# Patient Record
Sex: Male | Born: 1970 | Race: Black or African American | Hispanic: No | Marital: Married | Smoking: Never smoker
Health system: Southern US, Community
[De-identification: ages and names within clinical notes are randomized; demographics above are authoritative.]

## PROBLEM LIST (undated history)

## (undated) DIAGNOSIS — I499 Cardiac arrhythmia, unspecified: Secondary | ICD-10-CM

## (undated) DIAGNOSIS — I4891 Unspecified atrial fibrillation: Secondary | ICD-10-CM

## (undated) HISTORY — PX: PATELLAR TENDON REPAIR: SHX737

---

## 2002-11-23 ENCOUNTER — Emergency Department (HOSPITAL_COMMUNITY): Admission: EM | Admit: 2002-11-23 | Discharge: 2002-11-23 | Payer: Self-pay | Admitting: Emergency Medicine

## 2003-08-18 ENCOUNTER — Inpatient Hospital Stay (HOSPITAL_COMMUNITY): Admission: EM | Admit: 2003-08-18 | Discharge: 2003-08-28 | Payer: Self-pay | Admitting: Emergency Medicine

## 2003-10-09 ENCOUNTER — Encounter: Admission: RE | Admit: 2003-10-09 | Discharge: 2003-12-04 | Payer: Self-pay | Admitting: Orthopedic Surgery

## 2004-03-20 ENCOUNTER — Ambulatory Visit: Payer: Self-pay | Admitting: Orthopedic Surgery

## 2004-06-17 ENCOUNTER — Observation Stay (HOSPITAL_COMMUNITY): Admission: EM | Admit: 2004-06-17 | Discharge: 2004-06-18 | Payer: Self-pay | Admitting: Emergency Medicine

## 2012-11-10 ENCOUNTER — Emergency Department (HOSPITAL_COMMUNITY)
Admission: EM | Admit: 2012-11-10 | Discharge: 2012-11-10 | Disposition: A | Discharge status: Home / Self Care | Payer: Self-pay | Attending: Emergency Medicine | Admitting: Emergency Medicine

## 2012-11-10 ENCOUNTER — Encounter (HOSPITAL_COMMUNITY): Payer: Self-pay | Admitting: *Deleted

## 2012-11-10 DIAGNOSIS — R0609 Other forms of dyspnea: Secondary | ICD-10-CM | POA: Insufficient documentation

## 2012-11-10 DIAGNOSIS — I480 Paroxysmal atrial fibrillation: Secondary | ICD-10-CM

## 2012-11-10 DIAGNOSIS — R0989 Other specified symptoms and signs involving the circulatory and respiratory systems: Secondary | ICD-10-CM | POA: Insufficient documentation

## 2012-11-10 DIAGNOSIS — R000 Tachycardia, unspecified: Secondary | ICD-10-CM | POA: Insufficient documentation

## 2012-11-10 DIAGNOSIS — I4891 Unspecified atrial fibrillation: Secondary | ICD-10-CM | POA: Insufficient documentation

## 2012-11-10 HISTORY — DX: Unspecified atrial fibrillation: I48.91

## 2012-11-10 LAB — POCT I-STAT TROPONIN I: Troponin i, poc: 0.05 ng/mL (ref 0.00–0.08)

## 2012-11-10 MED ORDER — DILTIAZEM HCL 25 MG/5ML IV SOLN
25.0000 mg | Freq: Once | INTRAVENOUS | Status: AC
  Administered 2012-11-10: 25 mg via INTRAVENOUS
  Filled 2012-11-10: qty 5

## 2012-11-10 MED ORDER — FLECAINIDE ACETATE 100 MG PO TABS
300.0000 mg | ORAL_TABLET | Freq: Once | ORAL | Status: AC
  Administered 2012-11-10: 300 mg via ORAL
  Filled 2012-11-10: qty 3

## 2012-11-10 MED ORDER — DILTIAZEM HCL 100 MG IV SOLR
5.0000 mg/h | INTRAVENOUS | Status: DC
  Administered 2012-11-10: 5 mg/h via INTRAVENOUS
  Filled 2012-11-10: qty 100

## 2012-11-10 NOTE — ED Notes (Signed)
Pt ambulating independently w/ steady gait on d/c in no acute distress, A&Ox4.D/c instructions reviewed w/ pt and family - pt and family deny any further questions or concerns at present.  

## 2012-11-10 NOTE — ED Notes (Signed)
Per EMS - pt from home, awoke from his sleep d/t rapid heart rate - pt found to be in a-fib on cardiac monitor at a rate of 200-220s, pt given IV adenosine 6mg  w/o change, pt was then given 20mg  of Cardizem and heart rate came down to 118, pt states he has a hx of atrial fibrillation which a cardiologist told him was d/t taking hydroxycut, pt is not currently taking any cardiac medications or anticoagulants. Pt A&Ox4 on arrival and in no acute distress, skin warm and dry.

## 2012-11-10 NOTE — ED Provider Notes (Signed)
CSN: [id number]     Arrival date & time 11/10/12  8469 History   First MD Initiated Contact with Patient 11/10/12 [phone]     Chief Complaint  Patient presents with  . Atrial Fibrillation  . Tachycardia   (Consider location/radiation/quality/duration/timing/severity/associated sxs/prior Treatment) Patient is a 42 y.o. male presenting with atrial fibrillation. The history is provided by the patient.  Atrial Fibrillation  He had onset about 2 hours ago of a sense of his heart racing. He denies chest pain, heaviness, tightness, pressure. There was slight dyspnea but no nausea or vomiting or diaphoresis. He has a history of major fibrillation in the past and this is similar. Last episode was about 8 years ago per nothing makes it better nothing makes it worse. He was given a dose of adenosine in the ambulance with no improvement and then given a dose of the chiasm which had his heart rate slowed to 118 before going back up. He denies smoking, drug use, ethanol use.  No past medical history on file. No past surgical history on file. No family history on file. History  Substance Use Topics  . Smoking status: Not on file  . Smokeless tobacco: Not on file  . Alcohol Use: Not on file    Review of Systems  All other systems reviewed and are negative.    Allergies  Review of patient's allergies indicates not on file.  Home Medications  No current outpatient prescriptions on file. BP 149/114  Pulse 60  Temp(Src) 97.9 F (36.6 C) (Oral)  Resp 28  Ht 5\' 10"  (1.778 m)  SpO2 99% Physical Exam  Nursing note and vitals reviewed.  Morbidly obese 42 year old male, resting comfortably and in no acute distress. Vital signs are significant for tachypnea with respiratory rate of 28, hypertension with blood pressure 149/114, and tachycardia with heart rate of 178. Oxygen saturation is 99%, which is normal. Head is normocephalic and atraumatic. PERRLA, EOMI. Oropharynx is clear. Neck is nontender  and supple without adenopathy or JVD. Back is nontender and there is no CVA tenderness. Lungs are clear without rales, wheezes, or rhonchi. Chest is nontender. Heart is tachycardic and irregular without murmur. Abdomen is soft, flat, nontender without masses or hepatosplenomegaly and peristalsis is normoactive. Extremities have no cyanosis or edema, full range of motion is present. Skin is warm and dry without rash. Neurologic: Mental status is normal, cranial nerves are intact, there are no motor or sensory deficits.  ED Course  Procedures (including critical care time) Labs Review Results for orders placed during the hospital encounter of 11/10/12  POCT I-STAT TROPONIN I      Result Value Range   Troponin i, poc 0.05  0.00 - 0.08 ng/mL   Comment 3              Date: 11/10/2012  Rate: 151  Rhythm: atrial fibrillation  QRS Axis: normal  Intervals: normal  ST/T Wave abnormalities: nonspecific ST/T changes  Conduction Disutrbances:none  Narrative Interpretation:  Atrial fibrillation with rapid ventricular response. Nonspecific ST and T changes which are probably rate related. No prior ECGs available for comparison.  Old EKG Reviewed: none available  CRITICAL CARE Performed by: MWUXL,KGMWN Total critical care time: 35 minutes Critical care time was exclusive of separately billable procedures and treating other patients. Critical care was necessary to treat or prevent imminent or life-threatening deterioration. Critical care was time spent personally by me on the following activities: development of treatment plan with patient and/or surrogate as  well as nursing, discussions with consultants, evaluation of patient's response to treatment, examination of patient, obtaining history from patient or surrogate, ordering and performing treatments and interventions, ordering and review of laboratory studies, ordering and review of radiographic studies, pulse oximetry and re-evaluation of  patient's condition.  MDM   1. Paroxysmal atrial fibrillation    New onset atrial fibrillation. Old records are reviewed and he did have a hospitalization in 2006 and one in 2005 but that no discharge summary or history and physical are on record. No prior ECGs are available. However, he has no history of coronary artery disease so he would be a good candidate for treatment with flecainide. He will be given to chiasm and placed on a diltiazem drip to control rate and is given a dose of oral flecainide.  6:16 AM He has converted to sinus rhythm. He is discharged with referral to cardiology.  Dione Booze, MD 11/10/12 [phone]

## 2012-12-01 ENCOUNTER — Encounter: Payer: Self-pay | Admitting: Cardiology

## 2013-01-02 ENCOUNTER — Encounter (HOSPITAL_COMMUNITY): Payer: Self-pay | Admitting: Emergency Medicine

## 2013-01-02 ENCOUNTER — Emergency Department (HOSPITAL_COMMUNITY)
Admission: EM | Admit: 2013-01-02 | Discharge: 2013-01-02 | Disposition: A | Discharge status: Home / Self Care | Payer: BC Managed Care – PPO | Attending: Emergency Medicine | Admitting: Emergency Medicine

## 2013-01-02 ENCOUNTER — Emergency Department (HOSPITAL_COMMUNITY): Payer: BC Managed Care – PPO

## 2013-01-02 DIAGNOSIS — M25462 Effusion, left knee: Secondary | ICD-10-CM

## 2013-01-02 DIAGNOSIS — M25469 Effusion, unspecified knee: Secondary | ICD-10-CM | POA: Insufficient documentation

## 2013-01-02 DIAGNOSIS — Z8679 Personal history of other diseases of the circulatory system: Secondary | ICD-10-CM | POA: Insufficient documentation

## 2013-01-02 DIAGNOSIS — Z9889 Other specified postprocedural states: Secondary | ICD-10-CM | POA: Insufficient documentation

## 2013-01-02 MED ORDER — IBUPROFEN 800 MG PO TABS: 800.0000 mg | ORAL_TABLET | Freq: Three times a day (TID) | ORAL | Status: DC

## 2013-01-02 MED ORDER — HYDROCODONE-ACETAMINOPHEN 5-325 MG PO TABS: 1.0000 | ORAL_TABLET | ORAL | Status: DC | PRN

## 2013-01-02 NOTE — ED Notes (Signed)
Patient with c/o left knee pain x 3 days. H/o patellar tendon repair in left knee. Denies any injury.

## 2013-01-02 NOTE — ED Notes (Signed)
nad noted prior to dc. Dc instructions reviewed with pt and explained. 2 scripts given prior to dc.

## 2013-01-04 NOTE — ED Provider Notes (Signed)
CSN: [id number]     Arrival date & time 01/02/13  1478 History   First MD Initiated Contact with Patient 01/02/13 1015     Chief Complaint  Patient presents with  . Knee Pain   (Consider location/radiation/quality/duration/timing/severity/associated sxs/prior Treatment) HPI Comments: Faheem Ziemann is a 42 y.o. Male presenting with left knee pain and swelling for the past 3 days.  He denies specific injury but does walks and stands frequently with his job. He had bilateral patellar tendon rupture repairs 9 years ago by Dr. Romeo Apple.  His pain is constant, aching and worse with weight bearing, better at rest.  There is no radiation of pain.   Patient is a 42 y.o. male presenting with knee pain. The history is provided by the patient.  Knee Pain Associated symptoms: no fever     Past Medical History  Diagnosis Date  . Atrial fibrillation    Past Surgical History  Procedure Laterality Date  . Patellar tendon repair     No family history on file. History  Substance Use Topics  . Smoking status: Never Smoker   . Smokeless tobacco: Not on file  . Alcohol Use: No    Review of Systems  Constitutional: Negative for fever.  Musculoskeletal: Positive for arthralgias and joint swelling. Negative for myalgias.  Neurological: Negative for weakness and numbness.    Allergies  Bee venom  Home Medications   Current Outpatient Rx  Name  Route  Sig  Dispense  Refill  . HYDROcodone-acetaminophen (NORCO/VICODIN) 5-325 MG per tablet   Oral   Take 1 tablet by mouth every 4 (four) hours as needed for moderate pain.   20 tablet   0   . ibuprofen (ADVIL,MOTRIN) 800 MG tablet   Oral   Take 1 tablet (800 mg total) by mouth 3 (three) times daily.   21 tablet   0    BP 162/98  Pulse 95  Temp(Src) 98.7 F (37.1 C) (Oral)  Resp 22  Ht 5\' 10"  (1.778 m)  Wt 355 lb (161.027 kg)  BMI 50.94 kg/m2  SpO2 98% Physical Exam  Constitutional: He appears well-developed and well-nourished.   HENT:  Head: Atraumatic.  Neck: Normal range of motion.  Cardiovascular:  Pulses:      Dorsalis pedis pulses are 2+ on the right side, and 2+ on the left side.  Pulses equal bilaterally  Musculoskeletal: He exhibits edema and tenderness.       Right shoulder: He exhibits bony tenderness and swelling. He exhibits no deformity.  Well healed bilateral anterior knee surgical incisions.  TTP anterior knee and patella.  Possible mild edema, difficult to determine given obesity. No ballotable effusion. Calf nontender.  Neurological: He is alert. He has normal strength. He displays normal reflexes. No sensory deficit.  Equal strength  Skin: Skin is warm and dry.  Psychiatric: He has a normal mood and affect.    ED Course  Procedures (including critical care time) Labs Review Labs Reviewed - No data to display Imaging Review Dg Knee Complete 4 Views Left  01/02/2013   CLINICAL DATA:  Left knee pain  EXAM: LEFT KNEE - COMPLETE 4+ VIEW  COMPARISON:  08/18/2003  FINDINGS: No fracture. No subluxation or dislocation. Hypertrophic spurring is visible in all 3 compartments. There is probably a joint effusion associated.  IMPRESSION: Tricompartmental degenerative changes.   Electronically Signed   By: Kennith Center M.D.   On: 01/02/2013 10:45    EKG Interpretation   None  MDM   1. Knee effusion, left    Effusion per xrays which were reviewed with patient. He was placed in knee immobilizer, crutches given.  Hydrocodone, ibuprofen,  RICE,  F/u with Dr. Romeo Apple.  Pt to call for appt.  Patients labs and/or radiological studies were viewed and considered during the medical decision making and disposition process.     Burgess Amor, PA-C 01/04/13 [phone]

## 2013-01-05 NOTE — ED Provider Notes (Signed)
Medical screening examination/treatment/procedure(s) were performed by non-physician practitioner and as supervising physician I was immediately available for consultation/collaboration.  EKG Interpretation   None        Ramzi Brathwaite, MD 01/05/13 1042 

## 2013-01-18 ENCOUNTER — Ambulatory Visit (INDEPENDENT_AMBULATORY_CARE_PROVIDER_SITE_OTHER): Payer: BC Managed Care – PPO | Admitting: Orthopedic Surgery

## 2013-01-18 ENCOUNTER — Encounter: Payer: Self-pay | Admitting: Orthopedic Surgery

## 2013-01-18 VITALS — BP 172/90 | Ht 70.5 in | Wt 371.0 lb

## 2013-01-18 DIAGNOSIS — S83249A Other tear of medial meniscus, current injury, unspecified knee, initial encounter: Secondary | ICD-10-CM | POA: Insufficient documentation

## 2013-01-18 DIAGNOSIS — IMO0002 Reserved for concepts with insufficient information to code with codable children: Secondary | ICD-10-CM

## 2013-01-18 DIAGNOSIS — S83242A Other tear of medial meniscus, current injury, left knee, initial encounter: Secondary | ICD-10-CM

## 2013-01-18 DIAGNOSIS — M23322 Other meniscus derangements, posterior horn of medial meniscus, left knee: Secondary | ICD-10-CM

## 2013-01-18 DIAGNOSIS — M23329 Other meniscus derangements, posterior horn of medial meniscus, unspecified knee: Secondary | ICD-10-CM

## 2013-01-18 MED ORDER — HYDROCODONE-ACETAMINOPHEN 5-325 MG PO TABS: 1.0000 | ORAL_TABLET | ORAL | Status: DC | PRN

## 2013-01-18 MED ORDER — IBUPROFEN 800 MG PO TABS: 800.0000 mg | ORAL_TABLET | Freq: Three times a day (TID) | ORAL | Status: DC

## 2013-01-18 NOTE — Patient Instructions (Signed)
MRI ordered

## 2013-01-18 NOTE — Progress Notes (Signed)
Patient ID: [name] Walters, male   DOB: 1970-07-19, 42 y.o.   MRN: [id number]  Chief Complaint  Patient presents with  . Knee Pain    Left knee pain d/t injury 12/29/12    Work-related injury  Left knee pain 8/10 constant. Medial sided knee. Sudden onset. Secondary to injury. Date of injury November 6.  Details of the injury 42 year old male who works for [employer]  He was carrying oxygen up some steps he planted going up the steps fell medial pain but was able to continue his job. 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 CDV: pulses are normal   3. Skin: normal  4. Lymph: nodes were not palpable/normal  5/6. Psychiatric: awake, alert and oriented, mood and affect normal   7. Neuro: normal sensation  8.   MSK  Gait: Abnormal gait, crutches, knee immobilizer 9.   Inspection left knee large anterior scar from previous surgery tenderness medial joint line no joint effusion 10. Range of Motion 0-90 11. Motor normal 12. Stability normal ligaments, positive Murray sign for medial joint line tenderness medial meniscal tear   Imaging x-ray  negative  Assessment: Torn medial meniscus unrelated to his previous patella tendon surgery    Plan: Recommend continue brace and crutches ice ibuprofen hydrocodone  Images and knee with MRI to assess meniscal tear, if torn recommend surgical removal  Out of work until MRI and then possibly further if positive .

## 2013-01-24 ENCOUNTER — Ambulatory Visit (HOSPITAL_COMMUNITY): Payer: BC Managed Care – PPO

## 2013-01-25 ENCOUNTER — Telehealth: Payer: Self-pay | Admitting: Radiology

## 2013-01-25 NOTE — Telephone Encounter (Signed)
Patient called and rescheduled his MRI at [facility] on 01-30-13. Patient has BCBS, authorization # [id number] and it expires on 02-21-13.

## 2013-01-27 ENCOUNTER — Encounter: Payer: Self-pay | Admitting: Orthopedic Surgery

## 2013-01-27 ENCOUNTER — Telehealth: Payer: Self-pay | Admitting: Orthopedic Surgery

## 2013-01-27 NOTE — Telephone Encounter (Signed)
Patient called to relay that he has re-scheduled and confirmed his MRI appintment at [facility] for Monday, 01/30/13/ 2:00p.m.  He has requested an out of work note, which I have provided and faxed to him at his home fax# [phone]; his cell ph# is [phone], which I have updated, from [phone] (wife's cell).  He is aware he will be contacted and further advised after MRI.

## 2013-01-30 ENCOUNTER — Encounter (HOSPITAL_COMMUNITY): Payer: Self-pay

## 2013-01-30 ENCOUNTER — Ambulatory Visit (HOSPITAL_COMMUNITY)
Admission: RE | Admit: 2013-01-30 | Discharge: 2013-01-30 | Disposition: A | Discharge status: Home / Self Care | Payer: BC Managed Care – PPO | Source: Ambulatory Visit | Attending: Orthopedic Surgery | Admitting: Orthopedic Surgery

## 2013-01-30 DIAGNOSIS — M76899 Other specified enthesopathies of unspecified lower limb, excluding foot: Secondary | ICD-10-CM | POA: Insufficient documentation

## 2013-01-30 DIAGNOSIS — M25469 Effusion, unspecified knee: Secondary | ICD-10-CM | POA: Insufficient documentation

## 2013-01-30 DIAGNOSIS — M2240 Chondromalacia patellae, unspecified knee: Secondary | ICD-10-CM | POA: Insufficient documentation

## 2013-01-30 DIAGNOSIS — M25569 Pain in unspecified knee: Secondary | ICD-10-CM | POA: Insufficient documentation

## 2013-01-30 DIAGNOSIS — M259 Joint disorder, unspecified: Secondary | ICD-10-CM | POA: Insufficient documentation

## 2013-01-30 DIAGNOSIS — S83242A Other tear of medial meniscus, current injury, left knee, initial encounter: Secondary | ICD-10-CM

## 2013-02-02 ENCOUNTER — Encounter: Payer: Self-pay | Admitting: Orthopedic Surgery

## 2013-02-02 ENCOUNTER — Ambulatory Visit (INDEPENDENT_AMBULATORY_CARE_PROVIDER_SITE_OTHER): Payer: BC Managed Care – PPO | Admitting: Orthopedic Surgery

## 2013-02-02 VITALS — BP 162/94 | Ht 70.0 in | Wt 371.0 lb

## 2013-02-02 DIAGNOSIS — Z5189 Encounter for other specified aftercare: Secondary | ICD-10-CM

## 2013-02-02 DIAGNOSIS — S83419A Sprain of medial collateral ligament of unspecified knee, initial encounter: Secondary | ICD-10-CM | POA: Insufficient documentation

## 2013-02-02 DIAGNOSIS — S83412D Sprain of medial collateral ligament of left knee, subsequent encounter: Secondary | ICD-10-CM

## 2013-02-02 NOTE — Progress Notes (Signed)
Patient ID: Mohamed Portlock, male   DOB: 02-Jul-1970, 42 y.o.   MRN: [id number]  Chief Complaint  Patient presents with  . 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 Complains of medial knee pain. Previous history of ruptured patellar tendon bilaterally in 2005 status post repair did well.   General appearance is normal, the patient is alert and oriented x3 with normal mood and affect.  BP 162/94  Ht 5\' 10"  (1.778 m)  Wt 371 lb (168.284 kg)  BMI 53.23 kg/m2 Clinical exam shows medial joint pain sent for MRI suspected meniscal tear. His MRI was read as bursitis but he basically has a torn MCL or sprained MCL. He has tenderness over the MCL and pain with valgus stress. Normal neurovascular exam left leg  Encounter Diagnosis  Name Primary?  . Knee MCL sprain, left, subsequent encounter Yes   Recommend hinged knee brace physical therapy 6 weeks out of work. Followup at that time.

## 2013-02-02 NOTE — Patient Instructions (Signed)
PT X 6 WEEKS  OOW 6 WEEKS  BRACE X 6 WEEKS

## 2013-02-14 ENCOUNTER — Ambulatory Visit (HOSPITAL_COMMUNITY)
Admission: RE | Admit: 2013-02-14 | Discharge: 2013-02-14 | Disposition: A | Discharge status: Home / Self Care | Payer: BC Managed Care – PPO | Source: Ambulatory Visit | Attending: Orthopedic Surgery | Admitting: Orthopedic Surgery

## 2013-02-14 DIAGNOSIS — R29898 Other symptoms and signs involving the musculoskeletal system: Secondary | ICD-10-CM | POA: Insufficient documentation

## 2013-02-14 DIAGNOSIS — IMO0001 Reserved for inherently not codable concepts without codable children: Secondary | ICD-10-CM | POA: Insufficient documentation

## 2013-02-14 DIAGNOSIS — M25569 Pain in unspecified knee: Secondary | ICD-10-CM | POA: Insufficient documentation

## 2013-02-14 DIAGNOSIS — M25669 Stiffness of unspecified knee, not elsewhere classified: Secondary | ICD-10-CM

## 2013-02-14 NOTE — Evaluation (Signed)
Physical Therapy Evaluation  Patient Details  Name: [name] Walters MRN: [id number] Date of Birth: 07/05/1970  Today's Date: 02/14/2013 Time: 7829-5621 PT Time Calculation (min): 40 min Charge:  1435-1515-evaluation             Visit#: 1 of 18  Re-eval: 03/16/13 Assessment Diagnosis: Lt MCL strain Next MD Visit: 12/29/2012 Prior Therapy: none  Authorization: BCBS     Authorization Visit#: 1 of 18   Past Medical History:  Past Medical History  Diagnosis Date  . Atrial fibrillation    Past Surgical History:  Past Surgical History  Procedure Laterality Date  . Patellar tendon repair      Subjective Symptoms/Limitations Symptoms: [name] Walters states that he was at work on 12/29/2012 and slipped going up steps injuring his Lt knee.  He has been given medication and is about 30% better but is still having difficulty with his knee therefore he has been referred to therapy.     How long can you sit comfortably?: Pt states sitting causes his Lt leg to ache after about 30 minutes. How long can you stand comfortably?: He has stood for 25-30 minutes How long can you walk comfortably?: walking without an assistvie device walking with his brace for about 30 minutes.  Pain Assessment Currently in Pain?: Yes Pain Score: 6  (least is a 2/10; worst 8/10) Pain Location: Knee Pain Orientation: Left;Medial;Anterior Pain Type: Chronic pain Pain Onset: More than a month ago Pain Frequency: Constant (Pt always has pain but it varies in intensity.) Pain Relieving Factors: ice at times  Effect of Pain on Daily Activities: increases pain.   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 Leisure: Hobbies-yes (Comment) Comments: weight lifting     Sensation/Coordination/Flexibility/Functional Tests Functional Tests Functional Tests: foto  23  Assessment LLE AROM (degrees) Left Knee Extension: 5 Left Knee Flexion: 105 LLE Strength Left Hip Flexion: 3+/5 Left Hip Extension: 3/5 Left Hip ABduction: 4/5 Left Hip ADduction: 4/5 Left Knee Flexion: 3+/5 Left Knee Extension: 4/5 Left Ankle Dorsiflexion: 4/5  Exercise/Treatments Mobility/Balance  Static Standing Balance Single Leg Stance - Right Leg: 30 Single Leg Stance - Left Leg: 18    Seated Long Arc Quad: 5 reps Supine Quad Sets: 10 reps Heel Slides: 5 reps Straight Leg Raises: 5 reps Sidelying Hip ABduction: 5 reps Hip ADduction: 5 reps Prone  Hamstring Curl: 5 reps Hip Extension: 5 reps   Physical Therapy Assessment and Plan PT Assessment and Plan Clinical Impression Statement: Pt referred to PT with a Lt MCL strain.  Pt exam demonstrates slight decreased ROM, decreased strength and slight decreased balance.  Pt will benefit from skilled therapy to address the former deficits and maximize pt functional ability Pt will benefit from skilled therapeutic intervention in order to improve on the following deficits: Decreased balance;Difficulty walking;Pain;Decreased range of motion Rehab Potential: Good PT Frequency: Min 3X/week PT Duration: 6 weeks PT Treatment/Interventions: Therapeutic activities;Therapeutic exercise;Manual techniques;Modalities PT Plan: Progress pt through strengthening to return to prior functional level.  May use manual or modalities to address pain.    Goals Home Exercise Program Pt/caregiver will Perform Home Exercise Program: For increased ROM;For increased strengthening PT Goal: Perform Home Exercise Program - Progress: Goal set today PT Short Term Goals Time to Complete Short Term Goals: 2 weeks PT Short Term Goal 1: Pt pain to be no greater than a 4/10  PT Short Term Goal 2: Pt ROM to be to 120 to be able to sit for 90 minutes PT Short Term Goal 3: Pt to be able to walk for an hour to complete shopping activity,. PT Long Term  Goals Time to Complete Long Term Goals:  (6 week) PT Long Term Goal 1: Pt pain to be no greater than 2/10  PT Long Term Goal 2: Pt strength to be wnl to allow pt to be able to squat to return to work activities. Long Term Goal 3: Pt able to work our with his weights at home.  Problem List Patient Active Problem List   Diagnosis Date Noted  . Left leg weakness 02/14/2013  . Stiffness of joint, not elsewhere classified, lower leg 02/14/2013  . Knee MCL sprain 02/02/2013  . Acute medial meniscal tear 01/18/2013  . Derangement of posterior horn of medial meniscus 01/18/2013    PT Plan of Care PT Home Exercise Plan: given  GP    RUSSELL,CINDY 02/14/2013, 4:19 PM  Physician Documentation Your signature is required to indicate approval of the treatment plan as stated above.  Please sign and either send electronically or make a copy of this report for your files and return this physician signed original.   Please mark one 1.__approve of plan  2. ___approve of plan with the following conditions.   ______________________________                                                          _____________________ Physician Signature                                                                                                             Date Signed but not read

## 2013-02-20 ENCOUNTER — Ambulatory Visit (HOSPITAL_COMMUNITY)
Admission: RE | Admit: 2013-02-20 | Discharge: 2013-02-20 | Disposition: A | Discharge status: Home / Self Care | Payer: BC Managed Care – PPO | Source: Ambulatory Visit | Attending: Family Medicine | Admitting: Family Medicine

## 2013-02-20 NOTE — Progress Notes (Signed)
Physical Therapy Treatment Patient Details  Name: [name] Walters MRN: [id number] Date of Birth: Jan 12, 1971  Today's Date: 02/20/2013 Time: 4098-1191 PT Time Calculation (min): 24 min Charges: Therex x 23'  Visit#: 2 of 18  Re-eval: 03/16/13  Authorization: BCBS  Authorization Visit#: 1 of 18   Subjective: Symptoms/Limitations Symptoms: Pt states that he does his exercises when he can. Pain Assessment Currently in Pain?: Yes Pain Score: 6  Pain Location: Knee Pain Orientation: Left;Anterior;Medial  Precautions/Restrictions     Exercise/Treatments Standing Heel Raises: 10 reps;Limitations Heel Raises Limitations: Toe raises x 10 Knee Flexion: 10 reps;Left Functional Squat: 10 reps Rocker Board: 2 minutes;Limitations Rocker Board Limitations: R/L  Physical Therapy Assessment and Plan PT Assessment and Plan Clinical Impression Statement: Progressed standing strengthening exercises with minimal difficulty after initial cueing and demo. Pt displays good distal quad contraction with quad sets. Pt has most difficulty with sideling adduction due to weakness. Encouraged HEP adherence. Pt plans to ice knee at home. PT Plan: Progress pt through strengthening to return to prior functional level.  May use manual or modalities to address pain.    Problem List Patient Active Problem List   Diagnosis Date Noted  . Left leg weakness 02/14/2013  . Stiffness of joint, not elsewhere classified, lower leg 02/14/2013  . Knee MCL sprain 02/02/2013  . Acute medial meniscal tear 01/18/2013  . Derangement of posterior horn of medial meniscus 01/18/2013    PT - End of Session Activity Tolerance: Patient tolerated treatment well General Behavior During Therapy: [facility] for tasks assessed/performed  Seth Bake, PTA 02/20/2013, 10:57 AM

## 2013-02-22 ENCOUNTER — Ambulatory Visit (HOSPITAL_COMMUNITY): Payer: BC Managed Care – PPO | Admitting: *Deleted

## 2013-02-22 ENCOUNTER — Telehealth (HOSPITAL_COMMUNITY): Payer: Self-pay

## 2013-02-24 ENCOUNTER — Inpatient Hospital Stay (HOSPITAL_COMMUNITY)
Admission: RE | Admit: 2013-02-24 | Payer: BC Managed Care – PPO | Source: Ambulatory Visit | Admitting: Physical Therapy

## 2013-02-27 ENCOUNTER — Ambulatory Visit (HOSPITAL_COMMUNITY)
Admission: RE | Admit: 2013-02-27 | Discharge: 2013-02-27 | Disposition: A | Discharge status: Home / Self Care | Payer: BC Managed Care – PPO | Source: Ambulatory Visit | Attending: Orthopedic Surgery | Admitting: Orthopedic Surgery

## 2013-02-27 DIAGNOSIS — M25569 Pain in unspecified knee: Secondary | ICD-10-CM | POA: Insufficient documentation

## 2013-02-27 DIAGNOSIS — R29898 Other symptoms and signs involving the musculoskeletal system: Secondary | ICD-10-CM | POA: Insufficient documentation

## 2013-02-27 DIAGNOSIS — M25669 Stiffness of unspecified knee, not elsewhere classified: Secondary | ICD-10-CM | POA: Insufficient documentation

## 2013-02-27 DIAGNOSIS — IMO0001 Reserved for inherently not codable concepts without codable children: Secondary | ICD-10-CM | POA: Insufficient documentation

## 2013-02-27 NOTE — Progress Notes (Signed)
Physical Therapy Treatment Patient Details  Name: [name] Walters MRN: [id number] Date of Birth: 03/14/1970  Today's Date: 02/27/2013 Time: [id number] PT Time Calculation (min): 35 min Charges: Therex x 33'  Visit#: 2 of 18  Re-eval: 03/16/13  Authorization: BCBS  Authorization Visit#: 2 of 18   Subjective: Symptoms/Limitations Symptoms: Pt states that he slipped over the weekend but caught himself. It put a strain on his knee. Pain Assessment Currently in Pain?: Yes Pain Score: 4  Pain Location: Knee Pain Orientation: Left;Medial   Exercise/Treatments Standing Heel Raises: 10 reps;Limitations Heel Raises Limitations: Toe raises x 10 Lateral Step Up: 10 reps;Hand Hold: 2;Step Height: 4" Functional Squat: 10 reps Rocker Board: 2 minutes;Limitations Rocker Board Limitations: R/L SLS: LLE: 25" max of 3 Supine Quad Sets: 10 reps;Limitations Quad Sets Limitations: 10" Straight Leg Raises: 10 reps Straight Leg Raise with External Rotation: 10 reps Sidelying Hip ABduction: 10 reps Hip ADduction: 10 reps Prone  Hamstring Curl: 10 reps;Limitations Hamstring Curl Limitations: 5#   Physical Therapy Assessment and Plan PT Assessment and Plan Clinical Impression Statement: Progressed standing activities with moderate difficulty due to weakness/instability. Pt requires multimodal cueing to improve form with lateral step ups. Encouraged HEP adherence. PT plans to ice at home. PT Plan: Progress pt through strengthening to return to prior functional level.  May use manual or modalities to address pain.     Problem List Patient Active Problem List   Diagnosis Date Noted  . Left leg weakness 02/14/2013  . Stiffness of joint, not elsewhere classified, lower leg 02/14/2013  . Knee MCL sprain 02/02/2013  . Acute medial meniscal tear 01/18/2013  . Derangement of posterior horn of medial meniscus 01/18/2013    PT - End of Session Activity Tolerance: Patient tolerated treatment  well General Behavior During Therapy: [facility] for tasks assessed/performed  Seth Bakeebekah Khian Remo, PTA 02/27/2013, 5:12 PM

## 2013-03-03 ENCOUNTER — Inpatient Hospital Stay (HOSPITAL_COMMUNITY)
Admission: RE | Admit: 2013-03-03 | Payer: BC Managed Care – PPO | Source: Ambulatory Visit | Admitting: Physical Therapy

## 2013-03-06 ENCOUNTER — Ambulatory Visit (HOSPITAL_COMMUNITY): Payer: BC Managed Care – PPO | Admitting: Physical Therapy

## 2013-03-08 ENCOUNTER — Ambulatory Visit (HOSPITAL_COMMUNITY): Payer: BC Managed Care – PPO | Admitting: Physical Therapy

## 2013-03-10 ENCOUNTER — Inpatient Hospital Stay (HOSPITAL_COMMUNITY)
Admission: RE | Admit: 2013-03-10 | Payer: BC Managed Care – PPO | Source: Ambulatory Visit | Admitting: Physical Therapy

## 2013-03-16 ENCOUNTER — Ambulatory Visit (INDEPENDENT_AMBULATORY_CARE_PROVIDER_SITE_OTHER): Payer: BC Managed Care – PPO | Admitting: Orthopedic Surgery

## 2013-03-16 ENCOUNTER — Encounter: Payer: Self-pay | Admitting: Orthopedic Surgery

## 2013-03-16 VITALS — BP 150/98 | Ht 70.0 in | Wt 371.0 lb

## 2013-03-16 DIAGNOSIS — S83419A Sprain of medial collateral ligament of unspecified knee, initial encounter: Secondary | ICD-10-CM

## 2013-03-16 NOTE — Patient Instructions (Addendum)
Resume therapy x 3 weeks   Return here in 3 weeks   OOW X 3 WEEKS

## 2013-03-16 NOTE — Progress Notes (Signed)
Patient ID: [name], male   DOB: Aug 07, 1970, 43 y.o.   MRN: [id number] Chief Complaint  Patient presents with  . Follow-up    6 week  recheck left knee s/p therapy     Status post injury to left knee thought to be related to the MCL and medial patellofemoral ligament. MRI shows no meniscal tear. Complains of medial knee pain. He has been to therapy is in a hinged brace is getting better  Review of systems mild swelling in the joint  Exam is regained his range of motion his knee feels stable he still has pain and tenderness on the medial side of the joint skin is intact gait is normal mood is normal BP 150/98  Ht 5\' 10"  (1.778 m)  Wt 371 lb (168.284 kg)  BMI 53.23 kg/m2  He is oriented x3.  Encounter Diagnosis  Name Primary?  . Knee MCL sprain Yes    Referred him for new braces the other one had stretched out  Recommend 3 weeks of therapy return for reevaluation continue work status as work for 3 weeks

## 2013-04-03 ENCOUNTER — Inpatient Hospital Stay (HOSPITAL_COMMUNITY)
Admission: RE | Admit: 2013-04-03 | Payer: BC Managed Care – PPO | Source: Ambulatory Visit | Admitting: Physical Therapy

## 2013-04-13 ENCOUNTER — Encounter: Payer: Self-pay | Admitting: Orthopedic Surgery

## 2013-04-13 ENCOUNTER — Ambulatory Visit (INDEPENDENT_AMBULATORY_CARE_PROVIDER_SITE_OTHER): Payer: BC Managed Care – PPO | Admitting: Orthopedic Surgery

## 2013-04-13 VITALS — BP 126/80 | Ht 70.0 in | Wt 371.0 lb

## 2013-04-13 DIAGNOSIS — IMO0002 Reserved for concepts with insufficient information to code with codable children: Secondary | ICD-10-CM

## 2013-04-13 DIAGNOSIS — M1710 Unilateral primary osteoarthritis, unspecified knee: Secondary | ICD-10-CM

## 2013-04-13 DIAGNOSIS — M1712 Unilateral primary osteoarthritis, left knee: Secondary | ICD-10-CM

## 2013-04-13 DIAGNOSIS — M23329 Other meniscus derangements, posterior horn of medial meniscus, unspecified knee: Secondary | ICD-10-CM

## 2013-04-13 DIAGNOSIS — S83419A Sprain of medial collateral ligament of unspecified knee, initial encounter: Secondary | ICD-10-CM

## 2013-04-13 NOTE — Patient Instructions (Signed)
Stay out of work until therapy can be arranged

## 2013-04-14 ENCOUNTER — Encounter: Payer: Self-pay | Admitting: Orthopedic Surgery

## 2013-04-14 NOTE — Progress Notes (Signed)
Patient ID: [name], male   DOB: 1971-02-16, 43 y.o.   MRN: [id number]  Chief Complaint  Patient presents with  . Follow-up    3 week recheck left knee (Patient did not do therapy)   Encounter Diagnoses  Name Primary?  . Medial meniscus, posterior horn derangement Yes  . Osteoarthritis of left knee   . Knee MCL sprain     HISTORY: Chief Complaint   Patient presents with   .  Knee Pain       Left knee pain d/t injury 12/29/12     Work-related injury  The patient injured his left knee on 12/29/2012 while at work. He is a 43 year old male who had a previous bilateral patellar tendon rupture with repair in 2005 recovered well back to gainful employment. Basically he was carrying oxygen up steps landed his foot felt medial pain continue to work that day but on the next day developed pain swelling and painful weightbearing. He was eventually seen on the emergency room on November 10 and x-rays showed arthritis he was started on crutches ibuprofen and hydrocodone.  He has been in a brace for the MCL injury. He had physical therapy. We continued his hydrocodone and ibuprofen. However, he has made very slow progress we are now at 3 months and he still cannot weight-bear normally. He denies catching locking or giving way while in the brace.  The MRI showed  IMPRESSION: 1. Medial bursitis with new degeneration of the midbody of the medial meniscus. 2. Repaired patellar tendon is intact. 3. New degenerative changes in the medial compartment. 4. 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 5. New slight chondromalacia of the patella.  Review of systems is negative at this time  Vital signs are stable.BP 126/80  Ht 5\' 10"  (1.778 m)  Wt 371 lb (168.284 kg)  BMI 53.23 kg/m2 General appearance is normal, the patient is alert and oriented x3 with normal mood and affect. Repeat examination the left knee shows he has regained his normal passive range of motion. He has intact  extensor mechanism with grade 5 strength. He has medial joint line tenderness with negative McMurray sign. Collateral ligaments are stable he has some pain with valgus stress at 30.   He was unable to complete his physical therapy session I  saw him last. He would be prudent to proceed with 3 more weeks of physical therapy for strengthening since his main problem right now seems to be single leg stance in terms of holding his weight.   Recommend continued physical therapy, continue to work. If he does not improve over the next month arthroscopic evaluation may be necessary to delineate the intra-articular findings noted on MRI.

## 2013-04-27 ENCOUNTER — Ambulatory Visit (HOSPITAL_COMMUNITY)
Admission: RE | Admit: 2013-04-27 | Discharge: 2013-04-27 | Disposition: A | Discharge status: Home / Self Care | Payer: BC Managed Care – PPO | Source: Ambulatory Visit | Attending: Orthopedic Surgery | Admitting: Orthopedic Surgery

## 2013-04-27 DIAGNOSIS — M6281 Muscle weakness (generalized): Secondary | ICD-10-CM | POA: Insufficient documentation

## 2013-04-27 DIAGNOSIS — M25669 Stiffness of unspecified knee, not elsewhere classified: Secondary | ICD-10-CM

## 2013-04-27 DIAGNOSIS — R29898 Other symptoms and signs involving the musculoskeletal system: Secondary | ICD-10-CM

## 2013-04-27 DIAGNOSIS — M25569 Pain in unspecified knee: Secondary | ICD-10-CM | POA: Insufficient documentation

## 2013-04-27 DIAGNOSIS — IMO0001 Reserved for inherently not codable concepts without codable children: Secondary | ICD-10-CM | POA: Insufficient documentation

## 2013-04-27 DIAGNOSIS — M25469 Effusion, unspecified knee: Secondary | ICD-10-CM | POA: Insufficient documentation

## 2013-04-27 NOTE — Evaluation (Addendum)
Physical Therapy Progress Note   Patient Details  Name: [name] Walters MRN: [id number] Date of Birth: March 14, 1970  Today's Date: 04/27/2013 Time: 1500-1600 PT Time Calculation (min): 60 min   1500 - 1510  Ultrasound  1515 - 1600   TE   40 min          Visit#: 3 of 12  Re-eval: 05/27/13 Assessment Diagnosis: Lt MCL strain Next MD Visit: 12/29/2012 Prior Therapy: completed 2 visits 12/25 and 1/5   Authorization: [employer]    Authorization Time Period:    Authorization Visit#: 3 of 12   Past Medical History:  Past Medical History  Diagnosis Date  . Atrial fibrillation    Past Surgical History:  Past Surgical History  Procedure Laterality Date  . Patellar tendon repair      Subjective Symptoms/Limitations Symptoms: return to rehab after last visit 02/27/2013, left knee improving, walking more but this causes swelling, trying to get ready for return to work  Pertinent History: delivers equipment for [employer] , B patellar tendon reupture 2005, work knee injury 11/6/ 2014 causing MCL strain, medial knee bursitis , has not returned to work  Limitations: Standing;Walking How long can you walk comfortably?: 20 - 25 min walking no device,  Patient Stated Goals: improve knee strength and stability  Pain Assessment Currently in Pain?: Yes Pain Score: 5 /10 average pain level  Pain Location: Knee Pain Orientation: Left Pain Type: Chronic pain   Prior Functioning  Prior Function Vocation: Full time employment 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 Leisure: Hobbies-yes (Comment) Comments: weight lifting     Sensation/Coordination/Flexibility/Functional Tests Functional Tests: 45  FOTO score   Assessment LLE AROM (degrees) Left Knee Extension: 1, initial 5  Left Knee Flexion: 120, initial 105  LLE Strength Left Knee Flexion: 4/5 Left Knee Extension: 4/5  Exercise/Treatments  Stretches Soleus Stretch: 2 reps;30 seconds Aerobic Stationary Bike: Nustep level 4,  8  min SPM > 45  For functional endurance and ROM  Machines for Strengthening Cybex Knee Flexion: left leg plate 3  2 x 15    Standing Heel Raises: 10 reps;Limitations Forward Step Up: 2 sets;10 reps;Step Height: 4";Hand Hold: 0 Functional Squat: 10 reps Stairs: 1 RT  Other Standing Knee Exercises: static forward lunge 10x Left leg in front, standing left leg ball toss 20x, standing on blance pad left leg wball taps on wall 20x      Modalities Modalities: Ultrasound Manual Therapy Manual Therapy: Other (comment) Other Manual Therapy: left knee kinseio t ape fan strip for swelling and one "i" strip along left medial joint line for pain  Ultrasound Ultrasound Location: left medial knee  8 min   Ultrasound Parameters: 44mz, 1.0 w/cm3  for pain, increaed cuirculation, and promote healing   Physical Therapy Assessment and Plan PT Assessment and Plan Clinical Impression Statement: patient has improved AROM in his knee and progressed with ambulation no device, requires skilled care to restore pain free stair  use, improve squatting, and decrease pain .   Pt will benefit from skilled therapeutic intervention in order to improve on the following deficits: Difficulty walking;Pain;Decreased strength;Decreased range of motion Rehab Potential: Good PT Frequency: Min 3X/week PT Duration:  (3 weeks ) PT Treatment/Interventions: Therapeutic activities;Therapeutic exercise;Manual techniques;Modalities;Stair training PT Plan: Progress pt through strengthening to return to prior functional level.  May use manual or modalities to address pain, functional strengthening, kinesio taping prn,     Goals Home Exercise Program Pt/caregiver will Perform Home Exercise Program: For increased  ROM;For increased strengthening PT Goal: Perform Home Exercise Program - Progress: Progressing toward goal PT Short Term Goals Time to Complete Short Term Goals: 2 weeks PT Short Term Goal 1: Pt pain to be no greater than a  4/10 PT Short Term Goal 1 - Progress: Partly met PT Short Term Goal 2: Pt ROM to be to 120 to be able to sit for 90 minutes PT Short Term Goal 2 - Progress: Met PT Short Term Goal 3: Pt to be able to walk for an hour to complete shopping activity,. 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 PT Long Term Goal 2 - Progress: Progressing toward goal  Problem List Patient Active Problem List   Diagnosis Date Noted  . Left leg weakness 02/14/2013  . Stiffness of joint, not elsewhere classified, lower leg 02/14/2013  . Knee MCL sprain 02/02/2013  . Acute medial meniscal tear 01/18/2013  . Derangement of posterior horn of medial meniscus 01/18/2013    PT Plan of Care Consulted and Agree with Plan of Care: Patient  GP Functional Assessment Tool Used: FOTO 45   [name] Walters 04/27/2013, 4:17 PM  Physician Documentation Your signature is required to indicate approval of the treatment plan as stated above.  Please sign and either send electronically or make a copy of this report for your files and return this physician signed original.   Please mark one 1.__approve of plan  2. ___approve of plan with the following conditions.   ______________________________                                                          _____________________ Physician Signature                                                                                                             Date

## 2013-05-04 ENCOUNTER — Inpatient Hospital Stay (HOSPITAL_COMMUNITY): Admission: RE | Admit: 2013-05-04 | Payer: BC Managed Care – PPO | Source: Ambulatory Visit

## 2013-05-16 ENCOUNTER — Encounter: Payer: Self-pay | Admitting: Orthopedic Surgery

## 2013-05-16 ENCOUNTER — Ambulatory Visit (INDEPENDENT_AMBULATORY_CARE_PROVIDER_SITE_OTHER): Payer: BC Managed Care – PPO | Admitting: Orthopedic Surgery

## 2013-05-16 VITALS — BP 133/91 | Ht 70.0 in | Wt 371.0 lb

## 2013-05-16 DIAGNOSIS — S83419A Sprain of medial collateral ligament of unspecified knee, initial encounter: Secondary | ICD-10-CM

## 2013-05-16 DIAGNOSIS — M23329 Other meniscus derangements, posterior horn of medial meniscus, unspecified knee: Secondary | ICD-10-CM

## 2013-05-16 NOTE — Patient Instructions (Signed)
Call back to schedule surgery

## 2013-05-16 NOTE — Progress Notes (Signed)
Patient ID: [name], male   DOB: [birth year], 43 y.o.   MRN: [id number]  Chief Complaint  Patient presents with  . Follow-up    3 - 4 week recheck left knee s/p therapy DOI 12/29/12   The patient injured his knee back in November we've treated him with couple rounds of therapy rest and bracing he still having medial joint line pain his MRI showed a questionable degenerative meniscal type tear which I felt to be old but because he has not improved after reexamination and continued medial joint line tenderness I recommend arthroscopic evaluation of the knee joint.   System review is negative   BP 133/91  Ht 5\' 10"  (1.778 m)  Wt 371 lb (168.284 kg)  BMI 53.23 kg/m2  General appearance is normal, the patient is alert and oriented x3 with normal mood and affect. Full range of motion no laxity well-healed anterior incision from previous surgery on his left knee for patellar tendon or quadriceps tendon rupture  Medial joint line tenderness   Rotatory maneuvers reproduce some of the pain   Torn medial meniscus   Sprain knee   He is agreeable he will call us back to arrange surgery

## 2013-05-31 ENCOUNTER — Telehealth: Payer: Self-pay | Admitting: Orthopedic Surgery

## 2013-05-31 ENCOUNTER — Other Ambulatory Visit: Payer: Self-pay | Admitting: *Deleted

## 2013-05-31 ENCOUNTER — Encounter: Payer: Self-pay | Admitting: *Deleted

## 2013-05-31 NOTE — Telephone Encounter (Signed)
Patient called to relay that he wishes to schedule his arthroscopic left knee surgery, per last office visit 05/16/13.  He is asking if he may have it scheduled in late April if possible.  His ph# is [phone].

## 2013-05-31 NOTE — Telephone Encounter (Signed)
Surgery was scheduled for 06/21/13, however, he will [place] cardiac [place]. I faxed a letter to them for medical clearance. Patient is aware.

## 2013-06-07 ENCOUNTER — Encounter (HOSPITAL_COMMUNITY): Payer: Self-pay | Admitting: Pharmacy Technician

## 2013-06-08 NOTE — Telephone Encounter (Signed)
Received cardiac clearance. Surgery set for 06/21/13

## 2013-06-14 ENCOUNTER — Telehealth: Payer: Self-pay | Admitting: Orthopedic Surgery

## 2013-06-14 NOTE — Patient Instructions (Signed)
Gae DryJackie Madrazo  06/14/2013   Your procedure is scheduled on:   06/21/2013  Report to [facility] at  1130  AM.  Call this number if you have problems the morning of surgery: [phone]   Remember:   Do not eat food or drink liquids after midnight.   Take these medicines the morning of surgery with A SIP OF WATER:  hydrocodone   Do not wear jewelry, make-up or nail polish.  Do not wear lotions, powders, or perfumes.   Do not shave 48 hours prior to surgery. Men may shave face and neck.  Do not bring valuables to the hospital.  [facility] is not responsible for any belongings or valuables.               Contacts, dentures or bridgework may not be worn into surgery.  Leave suitcase in the car. After surgery it may be brought to your room.  For patients admitted to the hospital, discharge time is determined by your treatment team.               Patients discharged the day of surgery will not be allowed to drive home.  Name and phone number of your driver: family  Special Instructions: Shower using CHG 2 nights before surgery and the night before surgery.  If you shower the day of surgery use CHG.  Use special wash - you have one bottle of CHG for all showers.  You should use approximately 1/3 of the bottle for each shower.   Please read over the following fact sheets that you were given: Pain Booklet, Coughing and Deep Breathing, Surgical Site Infection Prevention, Anesthesia Post-op Instructions and Care and Recovery After Surgery Arthroscopic Procedure, Knee An arthroscopic procedure can find what is wrong with your knee. PROCEDURE Arthroscopy is a surgical technique that allows your orthopedic surgeon to diagnose and treat your knee injury with accuracy. They will look into your knee through a small instrument. This is almost like a small (pencil sized) telescope. Because arthroscopy affects your knee less than open knee surgery, you can anticipate a more rapid recovery. Taking an  active role by following your caregiver's instructions will help with rapid and complete recovery. Use crutches, rest, elevation, ice, and knee exercises as instructed. The length of recovery depends on various factors including type of injury, age, physical condition, medical conditions, and your rehabilitation. Your knee is the joint between the large bones (femur and tibia) in your leg. Cartilage covers these bone ends which are smooth and slippery and allow your knee to bend and move smoothly. Two menisci, thick, semi-lunar shaped pads of cartilage which form a rim inside the joint, help absorb shock and stabilize your knee. Ligaments bind the bones together and support your knee joint. Muscles move the joint, help support your knee, and take stress off the joint itself. Because of this all programs and physical therapy to rehabilitate an injured or repaired knee require rebuilding and strengthening your muscles. AFTER THE PROCEDURE  After the procedure, you will be moved to a recovery area until most of the effects of the medication have worn off. Your caregiver will discuss the test results with you.  Only take over-the-counter or prescription medicines for pain, discomfort, or fever as directed by your caregiver. SEEK MEDICAL CARE IF:   You have increased bleeding from your wounds.  You see redness, swelling, or have increasing pain in your wounds.  You have pus coming from your  wound.  You have an oral temperature above 102 F (38.9 C).  You notice a bad smell coming from the wound or dressing.  You have severe pain with any motion of your knee. SEEK IMMEDIATE MEDICAL CARE IF:   You develop a rash.  You have difficulty breathing.  You have any allergic problems. Document Released: 02/07/2000 Document Revised: 05/04/2011 Document Reviewed: 08/31/2007 [facility] Patient Information 2014 [place]. PATIENT INSTRUCTIONS POST-ANESTHESIA  IMMEDIATELY FOLLOWING SURGERY:  Do not  drive or operate machinery for the first twenty four hours after surgery.  Do not make any important decisions for twenty four hours after surgery or while taking narcotic pain medications or sedatives.  If you develop intractable nausea and vomiting or a severe headache please notify your doctor immediately.  FOLLOW-UP:  Please make an appointment with your surgeon as instructed. You do not need to follow up with anesthesia unless specifically instructed to do so.  WOUND CARE INSTRUCTIONS (if applicable):  Keep a dry clean dressing on the anesthesia/puncture wound site if there is drainage.  Once the wound has quit draining you may leave it open to air.  Generally you should leave the bandage intact for twenty four hours unless there is drainage.  If the epidural site drains for more than 36-48 hours please call the anesthesia department.  QUESTIONS?:  Please feel free to call your physician or the hospital operator if you have any questions, and they will be happy to assist you.

## 2013-06-14 NOTE — Telephone Encounter (Addendum)
Regarding pre-authorization for out-patient surgery scheduled 06/21/13, CPT 29881, 29880, contacted [employer], ph [phone], reached Utilization Management secure voice mail, left detailed message as per option - * Update 06/21/13, addit'l CPT 29877. Follow up by 06/15/13 if no response.  (Response received 06/15/13)

## 2013-06-15 ENCOUNTER — Encounter (HOSPITAL_COMMUNITY): Payer: Self-pay

## 2013-06-15 ENCOUNTER — Other Ambulatory Visit: Payer: Self-pay

## 2013-06-15 ENCOUNTER — Encounter (HOSPITAL_COMMUNITY)
Admission: RE | Admit: 2013-06-15 | Discharge: 2013-06-15 | Disposition: A | Discharge status: Home / Self Care | Payer: BC Managed Care – PPO | Source: Ambulatory Visit | Attending: Orthopedic Surgery | Admitting: Orthopedic Surgery

## 2013-06-15 DIAGNOSIS — Z01810 Encounter for preprocedural cardiovascular examination: Secondary | ICD-10-CM | POA: Insufficient documentation

## 2013-06-15 DIAGNOSIS — Z01812 Encounter for preprocedural laboratory examination: Secondary | ICD-10-CM | POA: Insufficient documentation

## 2013-06-15 LAB — BASIC METABOLIC PANEL
BUN: 13 mg/dL (ref 6–23)
CALCIUM: 9.4 mg/dL (ref 8.4–10.5)
CO2: 30 mEq/L (ref 19–32)
Chloride: 102 mEq/L (ref 96–112)
Creatinine, Ser: 1.1 mg/dL (ref 0.50–1.35)
GFR calc non Af Amer: 82 mL/min — ABNORMAL LOW (ref >90)
Glucose, Bld: 171 mg/dL — ABNORMAL HIGH (ref 70–99)
Potassium: 4.3 mEq/L (ref 3.7–5.3)
SODIUM: 142 meq/L (ref 137–147)

## 2013-06-15 LAB — HEMOGLOBIN AND HEMATOCRIT, BLOOD
HEMATOCRIT: 42.2 % (ref 39.0–52.0)
HEMOGLOBIN: 13.3 g/dL (ref 13.0–17.0)

## 2013-06-15 NOTE — Progress Notes (Signed)
06/15/13 0916  OBSTRUCTIVE SLEEP APNEA  Have you ever been diagnosed with sleep apnea through a sleep study? No  Do you snore loudly (loud enough to be heard through closed doors)?  1  Do you often feel tired, fatigued, or sleepy during the daytime? 0  Has anyone observed you stop breathing during your sleep? 0  Do you have, or are you being treated for high blood pressure? 0  BMI more than 35 kg/m2? 1  Age over 41 years old? 0  Neck circumference greater than 40 cm/16 inches? 1  Gender: 1  Obstructive Sleep Apnea Score 4  Score 4 or greater  Results sent to PCP

## 2013-06-15 NOTE — Telephone Encounter (Signed)
[place] back from [place], Virl AxeLaura P, out-patient procedures as noted do not require pre-authorization; she confirmed his policy is current and active.

## 2013-06-20 NOTE — H&P (Signed)
  Chief Complaint   Patient presents with   .  Knee Pain       Left knee pain d/t injury 12/29/12     Work-related injury  Left knee pain 8/10 constant. Medial sided knee. Sudden onset. Secondary to injury. Date of injury November 6.  Details of the injury 43 year old male who works for [employer]  He was carrying oxygen up some steps he planted going up the steps fell medial pain but was able to continue his job. He woke up the next day with pain swelling and painful weightbearing  He ruptured his patellar tendon bilaterally years ago in 2005 had repair did well return to gainful employment  On November 10 he went to the hospital x-rays were obtained they showed arthritis  He was placed in a brace given some crutches ibuprofen and hydrocodone he presents for evaluation and treatment review of systems is negative except for occasional heart palpitations  No allergies  History of atrial fibrillation  Surgical history as described  Past Surgical History  Procedure Laterality Date  . Patellar tendon repair Bilateral 2005Romeo Apple- [facility]   Past Medical History  Diagnosis Date  . Atrial fibrillation    No family history on file. History   Social History  . Marital Status: Married    Spouse Name: N/A    Number of Children: N/A  . Years of Education: N/A   Social History Main Topics  . Smoking status: Never Smoker   . Smokeless tobacco: Not on file  . Alcohol Use: No  . Drug Use: No  . Sexual Activity: Yes    Birth Control/ Protection: None   Other Topics Concern  . Not on file   Social History Narrative  . 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 CDV: pulses are normal   3. Skin: normal  4. Lymph: nodes were not palpable/normal  5/6. Psychiatric: awake, alert and oriented, mood and affect normal   7. Neuro: normal sensation  8.   MSK  Gait: Abnormal gait, crutches, knee immobilizer 9.   Inspection left knee large anterior scar from previous surgery tenderness medial joint line no joint effusion 10. Range of Motion 0-90 11. Motor normal 12. Stability normal ligaments, positive Murray sign for medial joint line tenderness medial meniscal tear   Imaging x-ray negative  Assessment: Torn medial meniscus unrelated to his previous patella tendon surgery    Plan: Treatment included physical therapy, bracing. A period of rest and being out of work he did not improve. He continued to complain of medial joint line symptoms and is now brought to surgery to evaluate the medial meniscus for tear  Arthroscopy left knee partial medial meniscectomy

## 2013-06-21 ENCOUNTER — Ambulatory Visit (HOSPITAL_COMMUNITY)
Admission: RE | Admit: 2013-06-21 | Discharge: 2013-06-21 | Disposition: A | Discharge status: Home / Self Care | Payer: BC Managed Care – PPO | Source: Ambulatory Visit | Attending: Orthopedic Surgery | Admitting: Orthopedic Surgery

## 2013-06-21 ENCOUNTER — Encounter (HOSPITAL_COMMUNITY)
Admission: RE | Disposition: A | Discharge status: Home / Self Care | Payer: Self-pay | Source: Ambulatory Visit | Attending: Orthopedic Surgery

## 2013-06-21 ENCOUNTER — Encounter (HOSPITAL_COMMUNITY): Payer: BC Managed Care – PPO | Admitting: Anesthesiology

## 2013-06-21 ENCOUNTER — Ambulatory Visit (HOSPITAL_COMMUNITY): Payer: BC Managed Care – PPO | Admitting: Anesthesiology

## 2013-06-21 ENCOUNTER — Encounter (HOSPITAL_COMMUNITY): Payer: Self-pay | Admitting: *Deleted

## 2013-06-21 DIAGNOSIS — M1710 Unilateral primary osteoarthritis, unspecified knee: Secondary | ICD-10-CM

## 2013-06-21 DIAGNOSIS — M2240 Chondromalacia patellae, unspecified knee: Secondary | ICD-10-CM

## 2013-06-21 DIAGNOSIS — IMO0002 Reserved for concepts with insufficient information to code with codable children: Secondary | ICD-10-CM

## 2013-06-21 DIAGNOSIS — S83249A Other tear of medial meniscus, current injury, unspecified knee, initial encounter: Secondary | ICD-10-CM

## 2013-06-21 DIAGNOSIS — I4891 Unspecified atrial fibrillation: Secondary | ICD-10-CM | POA: Insufficient documentation

## 2013-06-21 DIAGNOSIS — W108XXA Fall (on) (from) other stairs and steps, initial encounter: Secondary | ICD-10-CM | POA: Insufficient documentation

## 2013-06-21 DIAGNOSIS — M23329 Other meniscus derangements, posterior horn of medial meniscus, unspecified knee: Secondary | ICD-10-CM

## 2013-06-21 DIAGNOSIS — M94269 Chondromalacia, unspecified knee: Secondary | ICD-10-CM

## 2013-06-21 DIAGNOSIS — I10 Essential (primary) hypertension: Secondary | ICD-10-CM | POA: Insufficient documentation

## 2013-06-21 DIAGNOSIS — Y990 Civilian activity done for income or pay: Secondary | ICD-10-CM | POA: Insufficient documentation

## 2013-06-21 DIAGNOSIS — Y9289 Other specified places as the place of occurrence of the external cause: Secondary | ICD-10-CM | POA: Insufficient documentation

## 2013-06-21 DIAGNOSIS — M179 Osteoarthritis of knee, unspecified: Secondary | ICD-10-CM

## 2013-06-21 HISTORY — PX: KNEE ARTHROSCOPY WITH MEDIAL MENISECTOMY: SHX5651

## 2013-06-21 SURGERY — ARTHROSCOPY, KNEE, WITH MEDIAL MENISCECTOMY
Anesthesia: Spinal | Site: Knee | Laterality: Left

## 2013-06-21 MED ORDER — BUPIVACAINE-EPINEPHRINE PF 0.5-1:200000 % IJ SOLN
INTRAMUSCULAR | Status: DC | PRN
  Administered 2013-06-21: 60 mL

## 2013-06-21 MED ORDER — EPINEPHRINE HCL 1 MG/ML IJ SOLN
INTRAMUSCULAR | Status: AC
  Filled 2013-06-21: qty 5

## 2013-06-21 MED ORDER — EPINEPHRINE HCL 1 MG/ML IJ SOLN
INTRAMUSCULAR | Status: AC
  Filled 2013-06-21: qty 3

## 2013-06-21 MED ORDER — PROPOFOL 10 MG/ML IV EMUL
INTRAVENOUS | Status: AC
  Filled 2013-06-21: qty 20

## 2013-06-21 MED ORDER — PROPOFOL INFUSION 10 MG/ML OPTIME
INTRAVENOUS | Status: DC | PRN
  Administered 2013-06-21: 75 ug/kg/min via INTRAVENOUS
  Administered 2013-06-21: 14:00:00 via INTRAVENOUS

## 2013-06-21 MED ORDER — BUPIVACAINE-EPINEPHRINE PF 0.5-1:200000 % IJ SOLN
INTRAMUSCULAR | Status: AC
Start: 2013-06-21 — End: 2013-06-21
  Filled 2013-06-21: qty 60

## 2013-06-21 MED ORDER — SODIUM CHLORIDE 0.9 % IR SOLN
Status: DC | PRN
  Administered 2013-06-21 (×4)

## 2013-06-21 MED ORDER — ONDANSETRON HCL 4 MG/2ML IJ SOLN: 4.0000 mg | Freq: Once | INTRAMUSCULAR | Status: AC | PRN

## 2013-06-21 MED ORDER — LIDOCAINE HCL (PF) 1 % IJ SOLN
INTRAMUSCULAR | Status: AC
  Filled 2013-06-21: qty 5

## 2013-06-21 MED ORDER — FENTANYL CITRATE 0.05 MG/ML IJ SOLN
INTRAMUSCULAR | Status: DC | PRN
  Administered 2013-06-21: 12.5 ug via INTRAVENOUS

## 2013-06-21 MED ORDER — FENTANYL CITRATE 0.05 MG/ML IJ SOLN
INTRAMUSCULAR | Status: DC | PRN
  Administered 2013-06-21: 12.5 ug via INTRATHECAL

## 2013-06-21 MED ORDER — PROMETHAZINE HCL 12.5 MG PO TABS: 12.5000 mg | ORAL_TABLET | Freq: Four times a day (QID) | ORAL | Status: AC | PRN

## 2013-06-21 MED ORDER — CEFAZOLIN SODIUM 1-5 GM-% IV SOLN
1.0000 g | Freq: Once | INTRAVENOUS | Status: AC
  Administered 2013-06-21: 1 g via INTRAVENOUS
  Filled 2013-06-21: qty 50

## 2013-06-21 MED ORDER — CHLORHEXIDINE GLUCONATE 4 % EX LIQD: 60.0000 mL | Freq: Once | CUTANEOUS | Status: DC

## 2013-06-21 MED ORDER — ONDANSETRON HCL 4 MG/2ML IJ SOLN
4.0000 mg | Freq: Once | INTRAMUSCULAR | Status: AC
  Administered 2013-06-21: 4 mg via INTRAVENOUS
  Filled 2013-06-21: qty 2

## 2013-06-21 MED ORDER — DEXTROSE 5 % IV SOLN: 3.0000 g | INTRAVENOUS | Status: DC

## 2013-06-21 MED ORDER — HYDROCODONE-ACETAMINOPHEN 7.5-325 MG PO TABS
1.0000 | ORAL_TABLET | Freq: Once | ORAL | Status: AC
  Administered 2013-06-21: 1 via ORAL
  Filled 2013-06-21: qty 1

## 2013-06-21 MED ORDER — BUPIVACAINE IN DEXTROSE 0.75-8.25 % IT SOLN
INTRATHECAL | Status: AC
Start: 2013-06-21 — End: 2013-06-21
  Filled 2013-06-21: qty 2

## 2013-06-21 MED ORDER — CEFAZOLIN SODIUM-DEXTROSE 2-3 GM-% IV SOLR
2.0000 g | Freq: Once | INTRAVENOUS | Status: AC
  Administered 2013-06-21: 2 g via INTRAVENOUS
  Filled 2013-06-21: qty 50

## 2013-06-21 MED ORDER — HYDROCODONE-ACETAMINOPHEN 10-325 MG PO TABS: 1.0000 | ORAL_TABLET | ORAL | Status: DC | PRN

## 2013-06-21 MED ORDER — FENTANYL CITRATE 0.05 MG/ML IJ SOLN
INTRAMUSCULAR | Status: AC
  Filled 2013-06-21: qty 2

## 2013-06-21 MED ORDER — BUPIVACAINE IN DEXTROSE 0.75-8.25 % IT SOLN
INTRATHECAL | Status: DC | PRN
  Administered 2013-06-21: 15 mg via INTRATHECAL

## 2013-06-21 MED ORDER — LACTATED RINGERS IV SOLN
INTRAVENOUS | Status: DC
  Administered 2013-06-21: 1000 mL via INTRAVENOUS
  Administered 2013-06-21: 500 mL via INTRAVENOUS

## 2013-06-21 MED ORDER — EPINEPHRINE HCL 1 MG/ML IJ SOLN
INTRAMUSCULAR | Status: AC
  Filled 2013-06-21: qty 1

## 2013-06-21 MED ORDER — MIDAZOLAM HCL 2 MG/2ML IJ SOLN
INTRAMUSCULAR | Status: AC
  Filled 2013-06-21: qty 2

## 2013-06-21 MED ORDER — PHENYLEPHRINE HCL 10 MG/ML IJ SOLN
INTRAMUSCULAR | Status: DC | PRN
  Administered 2013-06-21 (×6): 100 ug via INTRAVENOUS

## 2013-06-21 MED ORDER — MIDAZOLAM HCL 2 MG/2ML IJ SOLN
1.0000 mg | INTRAMUSCULAR | Status: DC | PRN
  Administered 2013-06-21: 2 mg via INTRAVENOUS

## 2013-06-21 MED ORDER — FENTANYL CITRATE 0.05 MG/ML IJ SOLN: 25.0000 ug | INTRAMUSCULAR | Status: DC | PRN

## 2013-06-21 SURGICAL SUPPLY — 54 items
ARTHROWAND PARAGON T2 (SURGICAL WAND)
BAG HAMPER (MISCELLANEOUS) ×2 IMPLANT
BANDAGE ELASTIC 4 VELCRO NS (GAUZE/BANDAGES/DRESSINGS) ×2 IMPLANT
BANDAGE ELASTIC 6 VELCRO NS (GAUZE/BANDAGES/DRESSINGS) ×2 IMPLANT
BLADE 11 SAFETY STRL DISP (BLADE) ×2 IMPLANT
BLADE AGGRESSIVE PLUS 4.0 (BLADE) ×2 IMPLANT
CHLORAPREP W/TINT 26ML (MISCELLANEOUS) ×4 IMPLANT
CLOTH BEACON ORANGE TIMEOUT ST (SAFETY) ×2 IMPLANT
COOLER CRYO IC GRAV AND TUBE (ORTHOPEDIC SUPPLIES) ×2 IMPLANT
CUFF CRYO KNEE LG 20X31 COOLER (ORTHOPEDIC SUPPLIES) ×2 IMPLANT
CUFF CRYO KNEE18X23 MED (MISCELLANEOUS) IMPLANT
CUFF TOURNIQUET SINGLE 34IN LL (TOURNIQUET CUFF) IMPLANT
CUFF TOURNIQUET SINGLE 44IN (TOURNIQUET CUFF) ×2 IMPLANT
CUTTER ANGLED DBL BITE 4.5 (BURR) IMPLANT
DECANTER SPIKE VIAL GLASS SM (MISCELLANEOUS) ×4 IMPLANT
DRSG XEROFORM 1X8 (GAUZE/BANDAGES/DRESSINGS) ×2 IMPLANT
GAUZE SPONGE 4X4 16PLY XRAY LF (GAUZE/BANDAGES/DRESSINGS) IMPLANT
GAUZE XEROFORM 5X9 LF (GAUZE/BANDAGES/DRESSINGS) IMPLANT
GLOVE BIOGEL PI IND STRL 7.5 (GLOVE) ×1 IMPLANT
GLOVE BIOGEL PI INDICATOR 7.5 (GLOVE) ×1
GLOVE ECLIPSE 7.0 STRL STRAW (GLOVE) ×2 IMPLANT
GLOVE EXAM NITRILE PF MED BLUE (GLOVE) ×2 IMPLANT
GLOVE SKINSENSE NS SZ8.0 LF (GLOVE) ×1
GLOVE SKINSENSE STRL SZ8.0 LF (GLOVE) ×1 IMPLANT
GLOVE SS N UNI LF 8.5 STRL (GLOVE) ×2 IMPLANT
GOWN STRL REUS W/TWL LRG LVL3 (GOWN DISPOSABLE) ×2 IMPLANT
GOWN STRL REUS W/TWL XL LVL3 (GOWN DISPOSABLE) ×2 IMPLANT
HLDR LEG FOAM (MISCELLANEOUS) ×1 IMPLANT
IV NS IRRIG 3000ML ARTHROMATIC (IV SOLUTION) ×8 IMPLANT
KIT BLADEGUARD II DBL (SET/KITS/TRAYS/PACK) ×2 IMPLANT
KIT ROOM TURNOVER AP CYSTO (KITS) ×2 IMPLANT
LEG HOLDER FOAM (MISCELLANEOUS) ×1
MANIFOLD NEPTUNE II (INSTRUMENTS) ×2 IMPLANT
MARKER SKIN DUAL TIP RULER LAB (MISCELLANEOUS) ×2 IMPLANT
NEEDLE HYPO 18GX1.5 BLUNT FILL (NEEDLE) ×2 IMPLANT
NEEDLE HYPO 21X1.5 SAFETY (NEEDLE) ×2 IMPLANT
NEEDLE SPNL 18GX3.5 QUINCKE PK (NEEDLE) ×2 IMPLANT
NS IRRIG 1000ML POUR BTL (IV SOLUTION) ×2 IMPLANT
PACK ARTHRO LIMB DRAPE STRL (MISCELLANEOUS) ×2 IMPLANT
PAD ABD 5X9 TENDERSORB (GAUZE/BANDAGES/DRESSINGS) ×2 IMPLANT
PAD ARMBOARD 7.5X6 YLW CONV (MISCELLANEOUS) ×2 IMPLANT
PADDING CAST COTTON 6X4 STRL (CAST SUPPLIES) ×2 IMPLANT
PADDING WEBRIL 4 STERILE (GAUZE/BANDAGES/DRESSINGS) ×2 IMPLANT
SET ARTHROSCOPY INST (INSTRUMENTS) ×2 IMPLANT
SET ARTHROSCOPY PUMP TUBE (IRRIGATION / IRRIGATOR) ×2 IMPLANT
SET BASIN LINEN APH (SET/KITS/TRAYS/PACK) ×2 IMPLANT
SPONGE GAUZE 4X4 12PLY (GAUZE/BANDAGES/DRESSINGS) ×2 IMPLANT
SUT ETHILON 3 0 FSL (SUTURE) ×2 IMPLANT
SYR 30ML LL (SYRINGE) ×2 IMPLANT
SYRINGE 10CC LL (SYRINGE) ×2 IMPLANT
WAND 50 DEG COVAC W/CORD (SURGICAL WAND) IMPLANT
WAND 90 DEG TURBOVAC W/CORD (SURGICAL WAND) ×2 IMPLANT
WAND ARTHRO PARAGON T2 (SURGICAL WAND) IMPLANT
YANKAUER SUCT BULB TIP 10FT TU (MISCELLANEOUS) ×6 IMPLANT

## 2013-06-21 NOTE — Interval H&P Note (Signed)
History and Physical Interval Note:  06/21/2013 12:38 PM  [name] Walters  has presented today for surgery, with the diagnosis of Left Medial Meniscal Tear  The various methods of treatment have been discussed with the patient and family. After consideration of risks, benefits and other options for treatment, the patient has consented to  Procedure(s): KNEE ARTHROSCOPY WITH MEDIAL MENISECTOMY (Left) as a surgical intervention .  The patient's history has been reviewed, patient examined, no change in status, stable for surgery.  I have reviewed the patient's chart and labs.  Questions were answered to the patient's satisfaction.     Vickki HearingStanley E Coltrane Tugwell

## 2013-06-21 NOTE — Progress Notes (Signed)
Resting quietly. Awake. Denies pain. cryocuff water exchanged. Sensation level at bilateral toes. Moves bilateral feet without difficulty. Unable raise bottom off bed.

## 2013-06-21 NOTE — Progress Notes (Signed)
Discharge instructions and rx given to wife and pt. Voiced understanding.

## 2013-06-21 NOTE — Transfer of Care (Addendum)
Immediate Anesthesia Transfer of Care Note  Patient: [name] Walters  Procedure(s) Performed: Procedure(s) (LRB): LEFT KNEE ARTHROSCOPY WITH MEDIAL MENISECTOMY WITH CHONDROPLASTY, MEDIAL FEMORAL CHONDYLE AND PATELLA (Left)  Patient Location: PACU  Anesthesia Type: SAB  Level of Consciousness: awake  Airway & Oxygen Therapy: Patient Spontanous Breathing and nasal canula  Post-op Assessment: Report given to PACU RN, Post -op Vital signs reviewed and stable. SAB Level  T 10  Post vital signs: Reviewed and stable  Complications: No apparent anesthesia complications

## 2013-06-21 NOTE — Anesthesia Preprocedure Evaluation (Addendum)
Anesthesia Evaluation  Patient identified by MRN, date of birth, ID band Patient awake    Reviewed: Allergy & Precautions, H&P , NPO status , Patient's Chart, lab work & pertinent test results, reviewed documented beta blocker date and time   Airway Mallampati: II TM Distance: >3 FB Neck ROM: Full    Dental  (+) Edentulous Upper, Edentulous Lower   Pulmonary    Pulmonary exam normal       Cardiovascular hypertension, + dysrhythmias Atrial Fibrillation Rhythm:Regular Rate:Normal     Neuro/Psych    GI/Hepatic negative GI ROS, Neg liver ROS,   Endo/Other  Morbid obesity  Renal/GU negative Renal ROS     Musculoskeletal negative musculoskeletal ROS (+)   Abdominal (+) + obese,  Abdomen: soft.    Peds  Hematology negative hematology ROS (+)   Anesthesia Other Findings   Reproductive/Obstetrics                          Anesthesia Physical Anesthesia Plan  ASA: III  Anesthesia Plan: Spinal   Post-op Pain Management:    Induction:   Airway Management Planned: Nasal Cannula  Additional Equipment:   Intra-op Plan:   Post-operative Plan:   Informed Consent: I have reviewed the patients History and Physical, chart, labs and discussed the procedure including the risks, benefits and alternatives for the proposed anesthesia with the patient or authorized representative who has indicated his/her understanding and acceptance.     Plan Discussed with: CRNA  Anesthesia Plan Comments:         Anesthesia Quick Evaluation

## 2013-06-21 NOTE — Anesthesia Procedure Notes (Signed)
Procedure Name: MAC Date/Time: 06/21/2013 1:13 PM Performed by: Franco NonesYATES, Cristian Grieves S Pre-anesthesia Checklist: Patient identified, Emergency Drugs available, Suction available, Timeout performed and Patient being monitored Patient Re-evaluated:Patient Re-evaluated prior to inductionOxygen Delivery Method: Nasal Cannula    Procedure Name: MAC Date/Time: 06/21/2013 1:25 PM Performed by: Franco NonesYATES, Ailyne Pawley S Pre-anesthesia Checklist: Patient identified, Emergency Drugs available, Suction available, Timeout performed and Patient being monitored Patient Re-evaluated:Patient Re-evaluated prior to inductionOxygen Delivery Method: Non-rebreather mask    Spinal  Patient location during procedure: OR Start time: 06/21/2013 1:25 PM End time: 06/21/2013 1:35 PM Staffing CRNA/Resident: Minerva AreolaYATES, Nikolos Billig S Preanesthetic Checklist Completed: patient identified, site marked, surgical consent, pre-op evaluation, timeout performed, IV checked, risks and benefits discussed and monitors and equipment checked Spinal Block Patient position: left lateral decubitus Prep: Betadine and prep x 3 Patient monitoring: heart rate, cardiac monitor, continuous pulse ox and blood pressure Approach: midline Location: L4-5 Injection technique: single-shot Needle Needle type: Spinocan  Needle gauge: 22 G Needle length: 12.7 cm Assessment Sensory level: T6 Events: clear prep and post injecton Additional Notes Attempts 3 Spinal tray  Lot [id number] Expiration 2016-09

## 2013-06-21 NOTE — Progress Notes (Signed)
Gait steady. Denies pain. Moves right leg without difficulty. Dressed with assistance from wife.

## 2013-06-21 NOTE — Anesthesia Postprocedure Evaluation (Signed)
  Anesthesia Post-op Note  Patient: [name] Walters  Procedure(s) Performed: Procedure(s): LEFT KNEE ARTHROSCOPY WITH MEDIAL MENISECTOMY WITH CHONDROPLASTY, MEDIAL FEMORAL CHONDYLE AND PATELLA (Left)  Patient Location: PACU  Anesthesia Type:Spinal  Level of Consciousness: awake and patient cooperative  Airway and Oxygen Therapy: Patient Spontanous Breathing  Post-op Pain: none  Post-op Assessment: Post-op Vital signs reviewed, Patient's Cardiovascular Status Stable, Respiratory Function Stable, No signs of Nausea or vomiting and Pain level controlled  Post-op Vital Signs: Reviewed and stable  Last Vitals:  Filed Vitals:   06/21/13 1545  BP: 125/66  Pulse: 73  Temp:   Resp: 14    Complications: No apparent anesthesia complications

## 2013-06-21 NOTE — Brief Op Note (Signed)
06/21/2013  2:52 PM  PATIENT:  [name] Walters  43 y.o. male  PRE-OPERATIVE DIAGNOSIS:  Left Medial Meniscal Tear  POST-OPERATIVE DIAGNOSIS:  Left Medial Meniscal Tear, ARTHRITIS, CHONDROMALACIA  PROCEDURE:  Procedure(s): LEFT KNEE ARTHROSCOPY WITH MEDIAL MENISECTOMY WITH CHONDROPLASTY, MEDIAL FEMORAL CHONDYLE AND PATELLA (Left)  SURGEON:  Surgeon(s) and Role:    * Vickki HearingStanley E Kourtni Stineman, MD - Primary  PHYSICIAN ASSISTANT:   ASSISTANTS: none   ANESTHESIA:   spinal  EBL:  Total I/O In: 700 [I.V.:700] Out: 0   BLOOD ADMINISTERED:none  DRAINS: none   LOCAL MEDICATIONS USED:  MARCAINE     SPECIMEN:  No Specimen  DISPOSITION OF SPECIMEN:  N/A  COUNTS:  YES  TOURNIQUET:    DICTATION: .Dragon Dictation  PLAN OF CARE: Discharge to home after PACU  PATIENT DISPOSITION:  PACU - hemodynamically stable.   Delay start of Pharmacological VTE agent (>24hrs) due to surgical blood loss or risk of bleeding: yes

## 2013-06-21 NOTE — Discharge Instructions (Signed)
Ankle Arthroscopy, Care After Refer to this sheet in the next few weeks. These instructions provide you with information on caring for yourself after your procedure. Your health care provider may also give you more specific instructions. Your treatment has been planned according to current medical practices, but problems sometimes occur. Call your health care provider if you have any problems or questions after your procedure. WHAT TO EXPECT AFTER THE PROCEDURE After your procedure, it is typical to have the following sensations:  Your ankles may be swollen, stiff, and painful.  You may be constipated from the pain medicine given. HOME CARE INSTRUCTIONS  Keep your ankle elevated and wrapped to keep swelling down and to decrease pain. This also speeds healing.  Apply ice to the injured area:  Put ice in a plastic bag.  Place a towel between your skin and the bag.  Leave the ice on for 20 minutes, 2 3 times a day.  If you are constipated, drink 6 8 glasses of water a day and eat fruits and vegetables.  Remove the dressings as directed and shower as directed by your health care provider.  If physical therapy and exercises are prescribed by your health care provider, do them as directed.  You will be given medicine to control the pain. Only take over-the-counter or prescription medicines for pain, discomfort, or fever as directed by your health care provider. SEEK MEDICAL CARE IF:  You have a fever.  You have pus coming from your incision site.  Your incision site begins to stink.  Your incision site breaks open after your stitches or tape has been removed.  You have numbness in your foot or toes that gets worse. SEEK IMMEDIATE MEDICAL CARE IF:   You have chest pain or shortness of breath.  You have ankle pain that is not relieved with pain medicine. Document Released: 11/30/2012 Document Reviewed: 09/28/2012 [facility] Patient Information 2014 [place], [place].    PATIENT  INSTRUCTIONS POST-ANESTHESIA  IMMEDIATELY FOLLOWING SURGERY:  Do not drive or operate machinery for the first twenty four hours after surgery.  Do not make any important decisions for twenty four hours after surgery or while taking narcotic pain medications or sedatives.  If you develop intractable nausea and vomiting or a severe headache please notify your doctor immediately.  FOLLOW-UP:  Please make an appointment with your surgeon as instructed. You do not need to follow up with anesthesia unless specifically instructed to do so.  WOUND CARE INSTRUCTIONS (if applicable):  Keep a dry clean dressing on the anesthesia/puncture wound site if there is drainage.  Once the wound has quit draining you may leave it open to air.  Generally you should leave the bandage intact for twenty four hours unless there is drainage.  If the epidural site drains for more than 36-48 hours please call the anesthesia department.  QUESTIONS?:  Please feel free to call your physician or the hospital operator if you have any questions, and they will be happy to assist you.

## 2013-06-22 ENCOUNTER — Encounter (HOSPITAL_COMMUNITY): Payer: Self-pay | Admitting: Orthopedic Surgery

## 2013-06-22 NOTE — Op Note (Signed)
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  Operative findings : MEDIAL grade 2 chondromalacia of the medial femoral condyle with torn medial meniscus at the junction of the body and posterior horn  LATERAL normal  PATELLA grade 2 chondromalacia of the medial facet of the patella and grade 4 chondromalacia the medial portion of the trochlea  TROCHLEA grade 4 chondromalacia trochlea   Indications for procedure pain mechanical symptoms unresponsive to nonoperative treatment  The patient was identified in the preop holding area as [name] Walters the left knee was confirmed as a surgical site and marked. The chart was reviewed  The patient was taken to the operating room and  was given appropriate preoperative antibiotic  and spinal  anesthesia was administered. The operative leg (left) was placed in the arthroscopic leg holder, the well leg was placed in a well leg holder  The left leg was then prepped and draped sterile The surgical site was confirmed and the timeout procedure was completed  The lateral portal was injected with Marcaine with epinephrine solution and a stab wound was made. The scope was placed in the lateral portal into the medial compartment. The  Diagnostic portion of the  arthroscopy was completed. A medial portal was established in the same fashion and a probe was placed into the joint. The diagnostic arthroscopy   was repeated using a probe to palpate intra-articular structures  A duckbill forceps was used to morcellized the meniscal tear, the fragments were removed with a motorized shaver. The meniscus was then balanced with an ArthroCare wand 90 probe.   A probe was then used  to confirm a stable rim.  This was followed by debridement and chondroplasty of the medial femoral condyle and the patella and trochlea  The knee was irrigated meniscal fragments remaining were removed. The portals were closed with 3-0 nylon suture. The knee joint was then injected with 45 cc of Marcaine with epinephrine. A sterile dressing was applied followed by an Ace bandage and a Cryo/Cuff.  The patient was extubated and taken to the recovery room in stable condition.

## 2013-06-26 ENCOUNTER — Ambulatory Visit (INDEPENDENT_AMBULATORY_CARE_PROVIDER_SITE_OTHER): Payer: BC Managed Care – PPO | Admitting: Orthopedic Surgery

## 2013-06-26 ENCOUNTER — Encounter: Payer: Self-pay | Admitting: Orthopedic Surgery

## 2013-06-26 VITALS — BP 144/98 | Ht 70.0 in | Wt 379.0 lb

## 2013-06-26 DIAGNOSIS — M1710 Unilateral primary osteoarthritis, unspecified knee: Secondary | ICD-10-CM

## 2013-06-26 DIAGNOSIS — M23329 Other meniscus derangements, posterior horn of medial meniscus, unspecified knee: Secondary | ICD-10-CM

## 2013-06-26 DIAGNOSIS — M179 Osteoarthritis of knee, unspecified: Secondary | ICD-10-CM

## 2013-06-26 DIAGNOSIS — IMO0002 Reserved for concepts with insufficient information to code with codable children: Secondary | ICD-10-CM

## 2013-06-26 MED ORDER — OXYCODONE-ACETAMINOPHEN 5-325 MG PO TABS: 1.0000 | ORAL_TABLET | ORAL | Status: AC | PRN

## 2013-06-26 NOTE — Patient Instructions (Signed)
Start PT 

## 2013-06-26 NOTE — Progress Notes (Signed)
Patient ID: [name] Walters, male   DOB: 12/09/1970, 43 y.o.   MRN: [id number]  Chief Complaint  Patient presents with  . Follow-up    Post op #v 1 SALK DOS 06/21/13    Postop visit #1 arthroscopy left knee chondroplasty patella medial femoral condyle partial medial meniscectomy Operative findings : MEDIAL grade 2 chondromalacia of the medial femoral condyle with torn medial meniscus at the junction of the body and posterior horn  LATERAL normal  PATELLA grade 2 chondromalacia of the medial facet of the patella and grade 4 chondromalacia the medial portion of the trochlea  TROCHLEA grade 4 chondromalacia trochlea   Sutures taken out today portals are clean  Start therapy  Weight-bear as tolerated  Return 3 weeks for reevaluation postop visit #2  Meds ordered this encounter  Medications  . oxyCODONE-acetaminophen (PERCOCET/ROXICET) 5-325 MG per tablet    Sig: Take 1 tablet by mouth every 4 (four) hours as needed for severe pain.    Dispense:  60 tablet    Refill:  0   Orders Placed This Encounter  Procedures  . Ambulatory referral to Physical Therapy    Referral Priority:  Routine    Referral Type:  Physical Medicine    Referral Reason:  Specialty Services Required    Requested Specialty:  Physical Therapy    Number of Visits Requested:  1

## 2013-07-13 ENCOUNTER — Ambulatory Visit (HOSPITAL_COMMUNITY)
Admission: RE | Admit: 2013-07-13 | Discharge: 2013-07-13 | Disposition: A | Discharge status: Home / Self Care | Payer: BC Managed Care – PPO | Source: Ambulatory Visit | Attending: Orthopedic Surgery | Admitting: Orthopedic Surgery

## 2013-07-13 DIAGNOSIS — R262 Difficulty in walking, not elsewhere classified: Secondary | ICD-10-CM | POA: Insufficient documentation

## 2013-07-13 DIAGNOSIS — M25669 Stiffness of unspecified knee, not elsewhere classified: Secondary | ICD-10-CM | POA: Insufficient documentation

## 2013-07-13 DIAGNOSIS — M25569 Pain in unspecified knee: Secondary | ICD-10-CM | POA: Insufficient documentation

## 2013-07-13 DIAGNOSIS — IMO0001 Reserved for inherently not codable concepts without codable children: Secondary | ICD-10-CM | POA: Insufficient documentation

## 2013-07-13 NOTE — Evaluation (Signed)
Physical Therapy Evaluation  Patient Details  Name: [name] Walters MRN: [id number] Date of Birth: 02-Apr-1970  Today's Date: 07/13/2013 Time: 1430-1530 PT Time Calculation (min): 60 min    1 Evaluation. TherEx 1500-1530          Visit#: 1 of 9  Re-eval: 08/12/13 Assessment Diagnosis: Lt knee arthroscopic surger resulting in defficulty walking.  Next MD Visit: Romeo AppleHarrison 07/18/13  Authorization: BCBS    Authorization Time Period:    Authorization Visit#: 1 of 9   Past Medical History:  Past Medical History  Diagnosis Date  . Atrial fibrillation    Past Surgical History:  Past Surgical History  Procedure Laterality Date  . Patellar tendon repair Bilateral 2005Romeo Apple- Harrison  . Knee arthroscopy with medial menisectomy Left 06/21/2013    Procedure: LEFT KNEE ARTHROSCOPY WITH MEDIAL MENISECTOMY WITH CHONDROPLASTY, MEDIAL FEMORAL CHONDYLE AND PATELLA;  Surgeon: Vickki HearingStanley E Harrison, MD;  Location: [facility];  Service: Orthopedics;  Laterality: Left;    Subjective Symptoms/Limitations Pertinent History: Patient recent knee arthroscopic surgery to repair Lt knee meniscus on 06/21/13. History of bilateral patellar rupture 2004. 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  Sensation/Coordination/Flexibility/Functional Tests Flexibility Thomas: Positive Obers: Positive 90/90:  Positive Functional Tests Functional Tests: Ely's test positive  Assessment RLE AROM (degrees) Right Knee Extension: -5 Right Knee Flexion: 108 RLE Strength Right Hip Flexion: 4/5 Right Hip Extension: 4/5 Right Hip External Rotation : 32 Right Hip Internal Rotation : 23 Right Hip ABduction: 3+/5 Right Hip ADduction: 3+/5 Right Knee Flexion: 4/5 Right Knee Extension: 3+/5 (limited by pin)  Exercise/Treatments Stretches 3D hip excursions 10x 3way calf stretch 4x10seconds each 3way hip flexor stretch 4x10 seconds each 3way hamstring stretch 4x 10seconds 2way Groin stretch 4x10seconds  Physical Therapy Assessment and Plan PT Assessment and Plan Clinical Impression Statement: Patient 42y/o male seen 3 weeks s/p surgery that displays difficulty walking secondary to limited knee and hip mobility and decreased strength following Lt knee arthroscopic surgery to repair his meniscus. patient demonstrated pain with full weight bearing during gait as patient demosntrated decreased weight shift . Following LE stretching and manual stretch fo quadraceps muscle patient noticed decreased apin and improved gait. Patient will benefit from skilled Physical therapy to increase strength and mobily to assist patient in returning to prior level of function.  Pt will benefit from skilled therapeutic intervention in order to improve on the following deficits: Abnormal gait;Decreased activity tolerance;Decreased balance;Decreased range of motion;Decreased mobility;Difficulty walking;Decreased strength;Impaired flexibility;Pain;Obesity;Increased fascial restricitons;Increased edema PT Frequency: Min 3X/week PT Duration: 4 weeks    Goals    Problem List Patient Active Problem List   Diagnosis Date Noted  . Chondromalacia of knee 06/21/2013  . OA (osteoarthritis) of knee 06/21/2013  . Left leg weakness 02/14/2013  . Stiffness of joint, not elsewhere classified, lower leg 02/14/2013  . Knee MCL sprain  02/02/2013  . Acute medial meniscal tear 01/18/2013  . Derangement of posterior horn of medial meniscus 01/18/2013    PT - End  of Session Activity Tolerance: Patient tolerated treatment well General Behavior During Therapy: [facility] for tasks assessed/performed  GP    Cash R Dewitt 07/13/2013, 3:45 PM  Physician Documentation Your signature is required to indicate approval of the treatment plan as stated above.  Please sign and either send electronically or make a copy of this report for your files and return this physician signed original.   Please mark one 1.__approve of plan  2. ___approve of plan with the following conditions.   ______________________________                                                          _____________________ Physician Signature                                                                                                             Date

## 2013-07-14 ENCOUNTER — Telehealth: Payer: Self-pay | Admitting: Orthopedic Surgery

## 2013-07-14 NOTE — Telephone Encounter (Signed)
Faxed occupational accident - work capacity form completed by Dr. Romeo Apple to fax# [phone] to attention Ena Dawley or Aggie Moats, ph# [phone].  Authorization attached.

## 2013-07-18 ENCOUNTER — Encounter: Payer: Self-pay | Admitting: Orthopedic Surgery

## 2013-07-18 ENCOUNTER — Ambulatory Visit (INDEPENDENT_AMBULATORY_CARE_PROVIDER_SITE_OTHER): Payer: BC Managed Care – PPO | Admitting: Orthopedic Surgery

## 2013-07-18 VITALS — BP 162/101 | Ht 70.0 in | Wt 379.0 lb

## 2013-07-18 DIAGNOSIS — Z9889 Other specified postprocedural states: Secondary | ICD-10-CM

## 2013-07-18 DIAGNOSIS — M23329 Other meniscus derangements, posterior horn of medial meniscus, unspecified knee: Secondary | ICD-10-CM | POA: Insufficient documentation

## 2013-07-18 MED ORDER — HYDROCODONE-ACETAMINOPHEN 7.5-325 MG PO TABS: 1.0000 | ORAL_TABLET | ORAL | Status: DC | PRN

## 2013-07-18 NOTE — Progress Notes (Signed)
Patient ID: [name] Walters, male   DOB: 02-Feb-1971, 43 y.o.   MRN: [id number]  Chief Complaint  Patient presents with  . Follow-up    Post op # 2 SALK DOS 06/21/13    BP 162/101  Ht 5\' 10"  (1.778 m)  Wt 379 lb (171.913 kg)  BMI 54.38 kg/m2  Patient comes in for his postop visit after his surgery on his knee this is his second postop visit. He is doing well he had one session of physical therapy he would like his pain medicine changed to something less strong.  He is on home exercise program and is not in therapy. He has 90 of flexion and passively full extension  Recommend weightbearing as tolerated transition to one crutch this week and then try to stop using the crutches next week  Followup in 3-4 weeks

## 2013-07-18 NOTE — Patient Instructions (Addendum)
Continue therapy   OOW continue

## 2013-07-19 ENCOUNTER — Ambulatory Visit (HOSPITAL_COMMUNITY)
Admission: RE | Admit: 2013-07-19 | Discharge: 2013-07-19 | Disposition: A | Discharge status: Home / Self Care | Payer: BC Managed Care – PPO | Source: Ambulatory Visit | Attending: Family Medicine | Admitting: Family Medicine

## 2013-07-19 NOTE — Progress Notes (Signed)
Physical Therapy Treatment Patient Details  Name: [name] Walters MRN: [id number] Date of Birth: [birth year]  Today's Date: 07/19/2013 Time: [id number] PT Time Calculation (min): 52 min Charge: TE [id number], Ice [phone]  Visit#: 2 of 9  Re-eval: 08/12/13 Assessment Diagnosis: Lt knee arthroscopic surger resulting in defficulty walking.  Next MD Visit: Romeo AppleHarrison 08/10/2013  Authorization: [employer]  Authorization Time Period:    Authorization Visit#: 2 of 9   Subjective: Symptoms/Limitations Symptoms: Pt entered dept with 1 crutch and WBAT as tolerated.  Current pain scale 6/10 Lt knee Pain Assessment Currently in Pain?: Yes Pain Score: 6  Pain Location: Knee Pain Orientation: Left;Anterior;Medial  Objective:   Exercise/Treatments Stretches Active Hamstring Stretch: Limitations Active Hamstring Stretch Limitations: 3way to chair 4x 10seconds Hip Flexor Stretch: Limitations Hip Flexor Stretch Limitations: 3way to chair 4x 20seconds Knee: Self-Stretch to increase Flexion: Limitations Knee: Self-Stretch Limitations: 10 reps 5" holds Gastroc Stretch: Limitations Gastroc Stretch Limitations: 3way at way 4x10seconds Aerobic Stationary Bike: 8' for ROM Standing Forward Lunges: Left;10 reps;Limitations Forward Lunges Limitations: on 8 in step Side Lunges: Both;10 reps Other Standing Knee Exercises: 3D hip Excursions Supine Short Arc Quad Sets: 10 reps Terminal Knee Extension: Left;10 reps Prone  Other Prone Exercises: TKE 10 x 5"  Modalities Modalities: Cryotherapy Cryotherapy Number Minutes Cryotherapy: 10 Minutes Cryotherapy Location: Knee Type of Cryotherapy: Ice pack  Physical Therapy Assessment and Plan PT Assessment and Plan Clinical Impression Statement: Began POC to improve hip mobility to improve weight bearing with static standing and gait.  Pt now WBAT with 1 crutch with instruction per MD to continue this week then progress to no AD.  Min cueng for sequencing  with crutch with improved gait mechnaics following.  Began recprocal bicycle for ROM, continued stretches to improve muscle flexibility and began exercises for strengthenig.  Improved knee extension with one degree lacking following TKE exercises.  Pt educated on benefits of ice for edema control with elevation and ankle pumps to assist.   PT Plan: Continue current POC with 1 crutch WBAT per MD this week then progress to no AD.  Focus with therex on improving weight bearing and mobility to improve ROM and functional strengthening.      Goals PT Short Term Goals Time to Complete Short Term Goals: 3 weeks PT Short Term Goal 1: Patient will be able to walk 15minutes without assitive device with pain <4/10 PT Short Term Goal 1 - Progress: Progressing toward goal PT Short Term Goal 2: Patient will be independent with home stretching program PT Short Term Goal 2 - Progress: Progressing toward goal PT Short Term Goal 3: Patient will be able to stand 15minutes with pain <3/10 so patient can prepare meals for self PT Long Term Goals Time to Complete Long Term Goals: 12 weeks PT Long Term Goal 1: Patient will be able to walk and staand > 60 minutes without assitive device with pain <2/10 so patient can perform normal out of house activities. PT Long Term Goal 2: patient will be able to squat and half knee to floor and lft 20lb so patient can demonstrate ability to perform all heavy at home chores.  Problem List Patient Active Problem List   Diagnosis Date Noted  . S/P left knee arthroscopy 07/18/2013  . Medial meniscus, posterior horn derangement 07/18/2013  . Chondromalacia of knee 06/21/2013  . OA (osteoarthritis) of knee 06/21/2013  . Left leg weakness 02/14/2013  . Stiffness of joint, not elsewhere classified, lower leg 02/14/2013  . Knee MCL  sprain 02/02/2013  . Acute medial meniscal tear 01/18/2013  . Derangement of posterior horn of medial meniscus 01/18/2013    PT - End of  Session Activity Tolerance: Patient tolerated treatment well General Behavior During Therapy: [facility] for tasks assessed/performed  GP    Juel Burrow 07/19/2013, 3:47 PM

## 2013-07-20 ENCOUNTER — Ambulatory Visit: Payer: BC Managed Care – PPO | Admitting: Orthopedic Surgery

## 2013-07-21 ENCOUNTER — Ambulatory Visit (HOSPITAL_COMMUNITY): Payer: BC Managed Care – PPO

## 2013-07-21 ENCOUNTER — Telehealth (HOSPITAL_COMMUNITY): Payer: Self-pay

## 2013-07-24 ENCOUNTER — Ambulatory Visit (HOSPITAL_COMMUNITY): Payer: BC Managed Care – PPO

## 2013-07-26 ENCOUNTER — Ambulatory Visit (HOSPITAL_COMMUNITY)
Admission: RE | Admit: 2013-07-26 | Discharge: 2013-07-26 | Disposition: A | Discharge status: Home / Self Care | Payer: BC Managed Care – PPO | Source: Ambulatory Visit | Attending: Orthopedic Surgery | Admitting: Orthopedic Surgery

## 2013-07-26 DIAGNOSIS — M25569 Pain in unspecified knee: Secondary | ICD-10-CM | POA: Insufficient documentation

## 2013-07-26 DIAGNOSIS — M25669 Stiffness of unspecified knee, not elsewhere classified: Secondary | ICD-10-CM | POA: Insufficient documentation

## 2013-07-26 DIAGNOSIS — M6281 Muscle weakness (generalized): Secondary | ICD-10-CM | POA: Insufficient documentation

## 2013-07-26 DIAGNOSIS — IMO0001 Reserved for inherently not codable concepts without codable children: Secondary | ICD-10-CM | POA: Insufficient documentation

## 2013-07-26 DIAGNOSIS — M25469 Effusion, unspecified knee: Secondary | ICD-10-CM | POA: Insufficient documentation

## 2013-07-26 NOTE — Progress Notes (Signed)
Physical Therapy Treatment Patient Details  Name: [name] Walters MRN: [id number] Date of Birth: Aug 22, 1970  Today's Date: 07/26/2013 Time: [id number] PT Time Calculation (min): 45 min   Charges: TherEx 1600-1630, Manual [id number] Visit#: 3 of 9  Re-eval: 08/12/13 Assessment Diagnosis: Lt knee arthroscopic surger resulting in defficulty walking.  Next MD Visit: Romeo AppleHarrison 08/10/2013  Authorization: BCBS  Authorization Visit#: 3 of 9   Subjective: Symptoms/Limitations Symptoms: Pt entered dept with 1 crutch and WBAT as tolerated.  Current pain scale 4/10 Lt knee Pain Assessment Currently in Pain?: Yes Pain Score: 4  Pain Location: Knee Pain Orientation: Left;Anterior;Medial  Exercise/Treatments Stretches Active Hamstring Stretch: Limitations Active Hamstring Stretch Limitations: 3way to chair 4x 10seconds Hip Flexor Stretch: Limitations Hip Flexor Stretch Limitations: 3way to chair 4x 20seconds Knee: Self-Stretch to increase Flexion: Limitations Knee: Self-Stretch Limitations: 10 reps 5" holds ITB Stretch: Limitations ITB Stretch Limitations: 8" box 10x 3second hold Gastroc Stretch: Limitations Gastroc Stretch Limitations: 3way at way 4x10seconds Standing Other Standing Knee Exercises: 3D hip Excursions   Manual Therapy Manual Therapy: Joint mobilization Joint Mobilization: soft tissue mobilization of quad, hamstring, calf, Grade 2, 3 anterior to posterior and posteriro to anteriro joint mobilizations.  Other Manual Therapy: lunge to 8" box with functional manual reaction technique to increase tibial internal rotation and talus IR abduction  Physical Therapy Assessment and Plan PT Assessment and Plan Clinical Impression Statement: Patient states increased pain today  which was attributed to limited hip frontal plane mobility secondary to limited glut mobility resulting in wide gait. Following manual techniques to improve hip knee and ankle mobility, patient stated decreased  pain. Will reinitiate strengthening next session to progress functional mobility.  PT Treatment/Interventions: Therapeutic activities;Therapeutic exercise;Manual techniques;Modalities;Stair training PT Plan: Continue current POC with 1 crutch WBAT per MD this week then progress to no AD.  Focus with therex on improving weight bearing and mobility to improve ROM and functional strengthening. Specifically add piriformis stretch, and talar mobilizations as needed to improve tibial and femoral internal rotation.     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 PT Long Term Goal 1 - Progress: Progressing toward goal PT Long Term Goal 2: patient will be able to squat and half knee to floor and lft 20lb so patient can demonstrate ability to perform all heavy at home chores. PT Long Term Goal 2 - Progress: Progressing toward goal  Problem List Patient Active Problem List   Diagnosis Date Noted  . S/P left knee arthroscopy 07/18/2013  . Medial meniscus, posterior horn derangement 07/18/2013  . Chondromalacia of knee 06/21/2013  . OA (osteoarthritis) of knee 06/21/2013  .  Left leg weakness  02/14/2013  . Stiffness of joint, not elsewhere classified, lower leg 02/14/2013  . Knee MCL sprain 02/02/2013  . Acute medial meniscal tear 01/18/2013  . Derangement of posterior horn of medial meniscus 01/18/2013    PT - End of Session Activity Tolerance: Patient tolerated treatment well General Behavior During Therapy: [facility] for tasks assessed/performed  GP    [name] Walters R Sahily Biddle 07/26/2013, 5:14 PM

## 2013-07-28 ENCOUNTER — Ambulatory Visit (HOSPITAL_COMMUNITY): Payer: BC Managed Care – PPO | Admitting: Physical Therapy

## 2013-07-31 ENCOUNTER — Ambulatory Visit (HOSPITAL_COMMUNITY): Payer: BC Managed Care – PPO | Admitting: Physical Therapy

## 2013-08-02 ENCOUNTER — Telehealth (HOSPITAL_COMMUNITY): Payer: Self-pay

## 2013-08-02 ENCOUNTER — Ambulatory Visit (HOSPITAL_COMMUNITY): Payer: BC Managed Care – PPO

## 2013-08-04 ENCOUNTER — Ambulatory Visit (HOSPITAL_COMMUNITY): Payer: BC Managed Care – PPO | Admitting: Physical Therapy

## 2013-08-07 ENCOUNTER — Telehealth (HOSPITAL_COMMUNITY): Payer: Self-pay

## 2013-08-07 ENCOUNTER — Ambulatory Visit (HOSPITAL_COMMUNITY): Payer: BC Managed Care – PPO | Admitting: Physical Therapy

## 2013-08-09 ENCOUNTER — Ambulatory Visit (HOSPITAL_COMMUNITY)
Admission: RE | Admit: 2013-08-09 | Discharge: 2013-08-09 | Disposition: A | Discharge status: Home / Self Care | Payer: BC Managed Care – PPO | Source: Ambulatory Visit | Attending: Family Medicine | Admitting: Family Medicine

## 2013-08-09 NOTE — Progress Notes (Signed)
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  Next MD Visit: Romeo AppleHarrison 08/10/2013  Authorization: [employer]  Authorization Time Period:    Authorization Visit#: 4 of 9   Subjective: Symptoms/Limitations Symptoms: Pt entered dept with 1 crutch with WBAT as tolerated. stated he hopes to be released from crutches tomorrow at MD apt.  Reports complaint with HEP without question. Pain Assessment Currently in Pain?: Yes Pain Score: 4  Pain Location: Knee Pain Orientation: Left;Medial  Objective:   Exercise/Treatments Stretches Active Hamstring Stretch: Limitations Active Hamstring Stretch Limitations: 3way to chair 4x 10seconds Hip Flexor Stretch: Limitations Hip Flexor Stretch Limitations: 3way to chair 2x 20seconds ITB Stretch: Limitations ITB Stretch Limitations: 8" box 10x 3second hold Piriformis Stretch: 2 reps;20 seconds Gastroc Stretch: 1 rep;30 seconds;Limitations Gastroc Stretch Limitations: slant board Standing Other Standing Knee Exercises: 3D hip Excursions   Manual Therapy Manual Therapy: Joint mobilization Joint Mobilization: Talar joint mobilizatoin complete by Jerilee Fieldash Dewitt, PT DPT.;  STM to quad and hamstring to reduce tighntess and MFR to medial quadriceps fascial restrictions.  Physical Therapy Assessment and Plan PT Assessment and Plan Clinical Impression Statement: Session focus on assuring pt compliant with current form and technique with stretches at home, pt able to demonstrate appropriate form with min cueing.  Continued 3D hip excursion to improve hip mobilty.  Manual techniques complete to improve talar mobility to improve dorsiflexion with gait and STM complete to  quadriceps to reduce tightness and fascial restrictions medial region.    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 PT Long Term Goal 2: patient will be able to squat and half knee to floor and lft 20lb so patient can demonstrate ability to perform all heavy at home chores.  Problem List Patient Active Problem List   Diagnosis Date Noted  . S/P left knee arthroscopy 07/18/2013  . Medial meniscus, posterior horn derangement 07/18/2013  . Chondromalacia of knee 06/21/2013  . OA (osteoarthritis) of knee 06/21/2013  . Left leg weakness 02/14/2013  . Stiffness of joint, not elsewhere classified, lower leg 02/14/2013  . Knee MCL sprain 02/02/2013  . Acute medial meniscal tear 01/18/2013  . Derangement of posterior horn of medial meniscus 01/18/2013    PT - End of Session Activity Tolerance: Patient tolerated treatment well General Behavior During Therapy: [facility] assessed/performed  GP    Juel BurrowCockerham, Marbin Olshefski Jo 08/09/2013, 4:37 PM

## 2013-08-10 ENCOUNTER — Encounter: Payer: Self-pay | Admitting: Orthopedic Surgery

## 2013-08-10 ENCOUNTER — Ambulatory Visit (INDEPENDENT_AMBULATORY_CARE_PROVIDER_SITE_OTHER): Payer: BC Managed Care – PPO | Admitting: Orthopedic Surgery

## 2013-08-10 VITALS — BP 139/97 | Ht 70.0 in | Wt 379.0 lb

## 2013-08-10 DIAGNOSIS — Z9889 Other specified postprocedural states: Secondary | ICD-10-CM

## 2013-08-10 DIAGNOSIS — IMO0002 Reserved for concepts with insufficient information to code with codable children: Secondary | ICD-10-CM

## 2013-08-10 DIAGNOSIS — M23329 Other meniscus derangements, posterior horn of medial meniscus, unspecified knee: Secondary | ICD-10-CM

## 2013-08-10 DIAGNOSIS — M1712 Unilateral primary osteoarthritis, left knee: Secondary | ICD-10-CM

## 2013-08-10 DIAGNOSIS — M1710 Unilateral primary osteoarthritis, unspecified knee: Secondary | ICD-10-CM

## 2013-08-10 MED ORDER — HYDROCODONE-ACETAMINOPHEN 7.5-325 MG PO TABS: 1.0000 | ORAL_TABLET | ORAL | Status: DC | PRN

## 2013-08-10 NOTE — Progress Notes (Signed)
Patient ID: [name] Walters, male   DOB: Mar 09, 1970, 43 y.o.   MRN: [id number]  Chief Complaint  Patient presents with  . Follow-up    3 week recheck post op SALK DOS 06/21/13    BP 139/97  Ht 5\' 10"  (1.778 m)  Wt 379 lb (171.913 kg)  BMI 54.3638 kg/m332  43 year old male had knee arthroscopy doing well in therapy     Physical Therapy Assessment and Plan PT Assessment and Plan Clinical Impression Statement: Session focus on assuring pt compliant with current form and technique with stretches at home, pt able to demonstrate appropriate form with min cueing.  Continued 3D hip excursion to improve hip mobilty.  Manual techniques complete to improve talar mobility to improve dorsiflexion with gait and STM complete to quadriceps to reduce tightness and fascial restrictions medial region.     Procedure arthroscopy left knee chondroplasty patella medial femoral condyle partial medial meniscectomy   Operative findings : MEDIAL grade 2 chondromalacia of the medial femoral condyle with torn medial meniscus at the junction of the body and posterior horn  LATERAL normal  PATELLA grade 2 chondromalacia of the medial facet of the patella and grade 4 chondromalacia the medial portion of the trochlea  TROCHLEA grade 4 chondromalacia trochlea   You can bring the knee to full extension minimal if any joint effusion is ambulating full weightbearing with crutch  He can progressively wean off the crutches. Continue therapy as ordered today. Very important to not stress this knee. We just need quadricep strength and motion. Important to not be too aggressive  Return 4 weeks

## 2013-08-10 NOTE — Patient Instructions (Signed)
Continue therapy  Return in 28 days  Continue same work status

## 2013-08-11 ENCOUNTER — Ambulatory Visit (HOSPITAL_COMMUNITY): Payer: BC Managed Care – PPO | Admitting: Physical Therapy

## 2013-08-11 ENCOUNTER — Telehealth (HOSPITAL_COMMUNITY): Payer: Self-pay

## 2013-09-11 ENCOUNTER — Ambulatory Visit (INDEPENDENT_AMBULATORY_CARE_PROVIDER_SITE_OTHER): Payer: BC Managed Care – PPO | Admitting: Orthopedic Surgery

## 2013-09-11 ENCOUNTER — Encounter: Payer: Self-pay | Admitting: Orthopedic Surgery

## 2013-09-11 ENCOUNTER — Other Ambulatory Visit: Payer: Self-pay | Admitting: *Deleted

## 2013-09-11 ENCOUNTER — Ambulatory Visit (HOSPITAL_COMMUNITY)
Admission: RE | Admit: 2013-09-11 | Discharge: 2013-09-11 | Disposition: A | Discharge status: Home / Self Care | Payer: BC Managed Care – PPO | Source: Ambulatory Visit | Attending: Orthopedic Surgery | Admitting: Orthopedic Surgery

## 2013-09-11 VITALS — BP 130/88 | Ht 70.0 in | Wt 379.0 lb

## 2013-09-11 DIAGNOSIS — Z9889 Other specified postprocedural states: Secondary | ICD-10-CM

## 2013-09-11 DIAGNOSIS — M23329 Other meniscus derangements, posterior horn of medial meniscus, unspecified knee: Secondary | ICD-10-CM

## 2013-09-11 DIAGNOSIS — M25469 Effusion, unspecified knee: Secondary | ICD-10-CM | POA: Diagnosis not present

## 2013-09-11 DIAGNOSIS — M23322 Other meniscus derangements, posterior horn of medial meniscus, left knee: Secondary | ICD-10-CM

## 2013-09-11 DIAGNOSIS — IMO0001 Reserved for inherently not codable concepts without codable children: Secondary | ICD-10-CM | POA: Diagnosis not present

## 2013-09-11 DIAGNOSIS — M25569 Pain in unspecified knee: Secondary | ICD-10-CM | POA: Insufficient documentation

## 2013-09-11 DIAGNOSIS — M25669 Stiffness of unspecified knee, not elsewhere classified: Secondary | ICD-10-CM | POA: Diagnosis not present

## 2013-09-11 DIAGNOSIS — M6281 Muscle weakness (generalized): Secondary | ICD-10-CM | POA: Diagnosis not present

## 2013-09-11 MED ORDER — HYDROCODONE-ACETAMINOPHEN 7.5-325 MG PO TABS: 1.0000 | ORAL_TABLET | ORAL | Status: DC | PRN

## 2013-09-11 NOTE — Progress Notes (Signed)
Patient ID: [name] Walters, male   DOB: 09-11-1970, 43 y.o.   MRN: [id number]  Chief Complaint  Patient presents with  . Follow-up    recheck left knee, DOS 06/21/13    Status post left knee arthroscopy with chondroplasty and partial medial meniscectomy. The patient continues to do well.  The knee joint was quiet today no swelling. His range of motion has progressed to 120. Has a good straight leg raise with mild weakness.  Resume therapy x4 weeks continue therapy until next visit. Continue to work status. Refill medication.

## 2013-09-11 NOTE — Progress Notes (Signed)
Physical Therapy Re-evaluation/Treatment  Patient Details  Name: [name] Walters MRN: [id number] Date of Birth: [birth year]  Today's Date: 09/11/2013 Time: 1448-1856 PT Time Calculation (min): 50 min Charge: MMT/ROM 3149-7026, TE 1455-1505, Manual [phone], Ice [phone]               Visit#: 5 of 17  Re-eval: 10/09/13 Assessment Diagnosis: Lt knee arthroscopic surger resulting in defficulty walking.  Next MD Visit: Aline Brochure 10/17/2013  Authorization:      Authorization Time Period:    Authorization Visit#: 5 of 19   Subjective Symptoms/Limitations Symptoms: Received order from MD to continue 3x 4 more weeks.  Pt stated he has been compliant with HEP 2x daily without questions.  Pt entered dept ambulating with no AD. How long can you sit comfortably?: Able to sit for 1 hour comfortably (75mnutes) How long can you stand comfortably?: Able to stand for 1-2 hours comfortably (had not attempted without crutches) How long can you walk comfortably?: Able to walk for 10 minutes without AD (had not attempted without crutches) Pain Assessment Currently in Pain?: Yes Pain Score: 3  Pain Location: Knee  Objective:   Cognition/Observation Observation/Other Assessments Observations: FOTO: 66% limited  Sensation/Coordination/Flexibility/Functional Tests Flexibility Thomas: Positive (was positive) Obers: Positive (was positive) 90/90: Positive (was positiove) Functional Tests Functional Tests: Ely's test positive  Assessment RLE Strength Right Hip Flexion:  (4+/5 was 4/5) Right Hip Extension: 4/5 (was 4/5) Right Hip External Rotation : 40 (was 32) Right Hip Internal Rotation : 28 (was 23) Right Hip ABduction: 4/5 (was 3+/5) Right Hip ADduction: 4/5 (was 3+/5) Right Knee Flexion:  (4+/5 was 4/5) Right Knee Extension: 4/5 (wqas 3+/5) LLE AROM (degrees) Left Knee Extension: 0 (was 0) Left Knee Flexion: 116 (was 110)  Exercise/Treatments Standing Step Down: Left;10 reps;Step  Height: 4" Stairs: 2RT reciprocal pattern with 1 HR   Modalities Modalities: Cryotherapy Manual Therapy Joint Mobilization: STM to quad and hamstring to reduce tighntess and MFR to medial quadriceps fascial restrictions with elevation for edema control Cryotherapy Number Minutes Cryotherapy: 10 Minutes Cryotherapy Location: Knee Type of Cryotherapy: Ice pack  Physical Therapy Assessment and Plan PT Assessment and Plan Clinical Impression Statement: Received order from MD to continue OPPT 3x week for 4 more weeks.  Reassessment complete with the following findings:  Pt reports compliance with HEP 2x daily with no questions about exercises or stretches.  Pt reports increased tolerance for sitting, standing and walking comfortably.  Pt now ambulates with no AD.  ROM increased to 0-116 (was 0-110).  Strength is progressing well.  Pt continues to demonstrate weak eccentric control descending stairs with c/o pain descending.  Manual technqiues complete at for edema and pain control.  Ended with ice for pain and edema and encouraged pt to apply ice with elevation to assist with edema control.  Recommend continuning OPPT for 4 more weeks to reach STG and LTGs.   PT Plan: Recommend continuing 3x 421mo weeks per MD to reach functional strengthening and pain goals.      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 PT Long Term Goal 1 - Progress: Progressing toward goal PT Long Term Goal 2: patient will be able to squat and half knee to floor and lft 20lb so patient can demonstrate ability to perform all heavy at home chores. PT Long Term Goal 2 - Progress: Progressing toward goal  Problem List Patient Active Problem List   Diagnosis Date Noted  . S/P left knee arthroscopy 07/18/2013  . Medial meniscus, posterior horn derangement 07/18/2013  . Chondromalacia of knee 06/21/2013  . OA (osteoarthritis) of knee 06/21/2013  . Left leg weakness 02/14/2013  . Stiffness of joint, not elsewhere classified, lower leg 02/14/2013  . Knee MCL sprain 02/02/2013  . Acute medial meniscal tear 01/18/2013  . Derangement of posterior horn of medial meniscus 01/18/2013    PT - End of Session Activity Tolerance: Patient tolerated treatment well General Behavior During Therapy: WFL for tasks assessed/performed PT Plan of Care PT Home Exercise Plan: 3D hip excursion  GP Functional Assessment Tool Used: FOTO status 41%, limited 59%  Aldona Lento 09/11/2013, 4:10 PM  Physician Documentation Your signature is required to indicate approval of the treatment plan as stated above.  Please sign and either send electronically or make a copy of this report for your files and return this physician signed original.   Please mark one 1.__approve of plan  2. ___approve of plan with the following conditions.   ______________________________                                                          _____________________ Physician Signature                                                                                                             Date

## 2013-09-11 NOTE — Patient Instructions (Signed)
Continue physical therapy Out of work four weeks Follow up in four weeks

## 2013-09-13 ENCOUNTER — Ambulatory Visit (HOSPITAL_COMMUNITY): Payer: BC Managed Care – PPO

## 2013-09-13 ENCOUNTER — Telehealth (HOSPITAL_COMMUNITY): Payer: Self-pay

## 2013-09-13 NOTE — Progress Notes (Signed)
Physical Therapy Re-evaluation/Treatment  Patient Details  Name: [name] Walters MRN: [id number] Date of Birth: [birth year]  Today's Date: 09/11/2013 Time: 1448-1856 PT Time Calculation (min): 50 min Charge: MMT/ROM 3149-7026, TE 1455-1505, Manual [phone], Ice [phone]               Visit#: 5 of 17  Re-eval: 10/09/13 Assessment Diagnosis: Lt knee arthroscopic surger resulting in defficulty walking.  Next MD Visit: Aline Brochure 10/17/2013  Authorization:      Authorization Time Period:    Authorization Visit#: 5 of 19   Subjective Symptoms/Limitations Symptoms: Received order from MD to continue 3x 4 more weeks.  Pt stated he has been compliant with HEP 2x daily without questions.  Pt entered dept ambulating with no AD. How long can you sit comfortably?: Able to sit for 1 hour comfortably (75mnutes) How long can you stand comfortably?: Able to stand for 1-2 hours comfortably (had not attempted without crutches) How long can you walk comfortably?: Able to walk for 10 minutes without AD (had not attempted without crutches) Pain Assessment Currently in Pain?: Yes Pain Score: 3  Pain Location: Knee  Objective:   Cognition/Observation Observation/Other Assessments Observations: FOTO: 66% limited  Sensation/Coordination/Flexibility/Functional Tests Flexibility Thomas: Positive (was positive) Obers: Positive (was positive) 90/90: Positive (was positiove) Functional Tests Functional Tests: Ely's test positive  Assessment RLE Strength Right Hip Flexion:  (4+/5 was 4/5) Right Hip Extension: 4/5 (was 4/5) Right Hip External Rotation : 40 (was 32) Right Hip Internal Rotation : 28 (was 23) Right Hip ABduction: 4/5 (was 3+/5) Right Hip ADduction: 4/5 (was 3+/5) Right Knee Flexion:  (4+/5 was 4/5) Right Knee Extension: 4/5 (wqas 3+/5) LLE AROM (degrees) Left Knee Extension: 0 (was 0) Left Knee Flexion: 116 (was 110)  Exercise/Treatments Standing Step Down: Left;10 reps;Step  Height: 4" Stairs: 2RT reciprocal pattern with 1 HR   Modalities Modalities: Cryotherapy Manual Therapy Joint Mobilization: STM to quad and hamstring to reduce tighntess and MFR to medial quadriceps fascial restrictions with elevation for edema control Cryotherapy Number Minutes Cryotherapy: 10 Minutes Cryotherapy Location: Knee Type of Cryotherapy: Ice pack  Physical Therapy Assessment and Plan PT Assessment and Plan Clinical Impression Statement: Received order from MD to continue OPPT 3x week for 4 more weeks.  Reassessment complete with the following findings:  Pt reports compliance with HEP 2x daily with no questions about exercises or stretches.  Pt reports increased tolerance for sitting, standing and walking comfortably.  Pt now ambulates with no AD.  ROM increased to 0-116 (was 0-110).  Strength is progressing well.  Pt continues to demonstrate weak eccentric control descending stairs with c/o pain descending.  Manual technqiues complete at for edema and pain control.  Ended with ice for pain and edema and encouraged pt to apply ice with elevation to assist with edema control.  Recommend continuning OPPT for 4 more weeks to reach STG and LTGs.   PT Plan: Recommend continuing 3x 421mo weeks per MD to reach functional strengthening and pain goals.      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 PT Long Term Goal 1 - Progress: Progressing toward goal PT Long Term Goal 2: patient will be able to squat and half knee to floor and lft 20lb so patient can demonstrate ability to perform all heavy at home chores. PT Long Term Goal 2 - Progress: Progressing toward goal  Problem List Patient Active Problem List   Diagnosis Date Noted  . S/P left knee arthroscopy 07/18/2013  . Medial meniscus, posterior horn derangement 07/18/2013  . Chondromalacia of knee 06/21/2013  . OA (osteoarthritis) of knee 06/21/2013  . Left leg weakness 02/14/2013  . Stiffness of joint, not elsewhere classified, lower leg 02/14/2013  . Knee MCL sprain 02/02/2013  . Acute medial meniscal tear 01/18/2013  . Derangement of posterior horn of medial meniscus 01/18/2013    PT - End of Session Activity Tolerance: Patient tolerated treatment well General Behavior During Therapy: WFL for tasks assessed/performed PT Plan of Care PT Home Exercise Plan: 3D hip excursion  GP Functional Assessment Tool Used: FOTO status 41%, limited 59%  Aldona Lento 09/11/2013, 4:10 PM  Devona Konig PT DPT  Physician Documentation Your signature is required to indicate approval of the treatment plan as stated above.  Please sign and either send electronically or make a copy of this report for your files and return this physician signed original.   Please mark one 1.__approve of plan  2. ___approve of plan with the following conditions.   ______________________________                                                          _____________________ Physician Signature                                                                                                              Date

## 2013-09-18 ENCOUNTER — Ambulatory Visit (HOSPITAL_COMMUNITY)
Admission: RE | Admit: 2013-09-18 | Discharge: 2013-09-18 | Disposition: A | Discharge status: Home / Self Care | Payer: BC Managed Care – PPO | Source: Ambulatory Visit | Attending: Orthopedic Surgery | Admitting: Orthopedic Surgery

## 2013-09-18 DIAGNOSIS — IMO0001 Reserved for inherently not codable concepts without codable children: Secondary | ICD-10-CM | POA: Diagnosis not present

## 2013-09-18 NOTE — Progress Notes (Signed)
Physical Therapy Treatment Patient Details  Name: [name] Walters MRN: [id number] Date of Birth: [birth year]  Today's Date: 09/18/2013 Time: 4098-11911525-1612 PT Time Calculation (min): 47 min Charge TE 1525-1550, Manual 1550-1600, Ice 1600-1610   Visit#: 6 of 17  Re-eval: 10/09/13 Assessment Diagnosis: Lt knee arthroscopic surger resulting in defficulty walking.  Next MD Visit: Romeo AppleHarrison 10/17/2013  Authorization: [employer]  Authorization Time Period:    Authorization Visit#: 6 of 19   Subjective: Symptoms/Limitations Symptoms: Pain scale 3/10 Pain Assessment Currently in Pain?: Yes Pain Score: 3  Pain Location: Knee Pain Orientation: Left  Objective:   Exercise/Treatments Stretches Active Hamstring Stretch: Limitations Active Hamstring Stretch Limitations: 3 directions on 10in step Hip Flexor Stretch: Limitations Hip Flexor Stretch Limitations: 3way to chair 2x 20seconds Gastroc Stretch: 3 reps;30 seconds;Limitations Gastroc Stretch Limitations: slant board Standing Forward Lunges: Left;10 reps;Limitations Forward Lunges Limitations: on 6in step Side Lunges: Both;10 reps;Limitations Side Lunges Limitations: on 4in step Lateral Step Up: Left;15 reps;Hand Hold: 1;Step Height: 4" Forward Step Up: Left;15 reps;Hand Hold: 0;Step Height: 6" Step Down: Left;15 reps;Hand Hold: 0;Step Height: 4"   Modalities Modalities: Cryotherapy Manual Therapy Manual Therapy: Myofascial release Myofascial Release: MFR to quad region with main focus on medial region of incision, gastroc, hamstrings Cryotherapy Number Minutes Cryotherapy: 10 Minutes Cryotherapy Location: Knee Type of Cryotherapy: Ice pack  Physical Therapy Assessment and Plan PT Assessment and Plan Clinical Impression Statement: Session focus on functioanl strengtheing and manual techniques to reduce pain.  Stair training with cueing to reduce compensation due to pain with activity, reduced height wtih stair training with no c/o  following.  Manual techniques complete to reduce fascial restrictions on medial region of knee for pain control.  Ice applied for pain and edema control.   PT Plan: Continue with current POC.  Begin squat reach matrix per pain tolerance.      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 PT Long Term Goal 2: patient will be able to squat and half knee to floor and lft 20lb so patient can demonstrate ability to perform all heavy at home chores.  Problem List Patient Active Problem List   Diagnosis Date Noted  . S/P left knee arthroscopy 07/18/2013  . Medial meniscus, posterior horn derangement 07/18/2013  . Chondromalacia of knee 06/21/2013  . OA (osteoarthritis) of knee 06/21/2013  . Left leg weakness 02/14/2013  . Stiffness of joint, not elsewhere classified, lower leg 02/14/2013  . Knee MCL sprain 02/02/2013  . Acute medial meniscal tear 01/18/2013  . Derangement of posterior horn of medial meniscus 01/18/2013    PT - End of Session Activity Tolerance: Patient tolerated treatment well General Behavior During Therapy: [facility] for tasks assessed/performed  GP    Juel BurrowCockerham, Che Rachal Jo 09/18/2013, 4:22 PM

## 2013-09-20 ENCOUNTER — Ambulatory Visit (HOSPITAL_COMMUNITY): Payer: BC Managed Care – PPO | Admitting: Physical Therapy

## 2013-10-03 ENCOUNTER — Ambulatory Visit (HOSPITAL_COMMUNITY)
Admission: RE | Admit: 2013-10-03 | Discharge: 2013-10-03 | Disposition: A | Discharge status: Home / Self Care | Payer: BC Managed Care – PPO | Source: Ambulatory Visit | Attending: Orthopedic Surgery | Admitting: Orthopedic Surgery

## 2013-10-03 DIAGNOSIS — IMO0001 Reserved for inherently not codable concepts without codable children: Secondary | ICD-10-CM | POA: Insufficient documentation

## 2013-10-03 DIAGNOSIS — M25569 Pain in unspecified knee: Secondary | ICD-10-CM | POA: Insufficient documentation

## 2013-10-03 DIAGNOSIS — M25469 Effusion, unspecified knee: Secondary | ICD-10-CM | POA: Insufficient documentation

## 2013-10-03 DIAGNOSIS — M6281 Muscle weakness (generalized): Secondary | ICD-10-CM | POA: Diagnosis not present

## 2013-10-03 DIAGNOSIS — M25669 Stiffness of unspecified knee, not elsewhere classified: Secondary | ICD-10-CM | POA: Diagnosis not present

## 2013-10-03 NOTE — Progress Notes (Signed)
Physical Therapy Treatment Patient Details  Name: [name] Walters MRN: [id number] Date of Birth: 15-Jan-1971  Today's Date: 10/03/2013 Time: [id number] PT Time Calculation (min): 47 min Charge: TE [id number], Manual 1505-1515, Ice [id number]  Visit#: 7 of 17  Re-eval: 10/09/13 Assessment Diagnosis: Lt knee arthroscopic surger resulting in defficulty walking.  Next MD Visit: Romeo AppleHarrison 10/17/2013  Authorization: BC[employer]  Authorization Time Period:    Authorization Visit#: 7 of 19   Subjective: Symptoms/Limitations Symptoms: Pain scale 3/10 today Pain Assessment Currently in Pain?: Yes Pain Score: 3  Pain Location: Knee Pain Orientation: Left  Objective:  Exercise/Treatments Stretches Active Hamstring Stretch: Limitations Active Hamstring Stretch Limitations: 3 directions on 10in step Hip Flexor Stretch: Limitations Hip Flexor Stretch Limitations: 3way to chair 2x 20seconds Gastroc Stretch: 3 reps;30 seconds;Limitations Gastroc Stretch Limitations: slant board Standing Forward Lunges: Left;Limitations;15 reps Forward Lunges Limitations: on 4in step Side Lunges: Both;15 reps;Limitations Side Lunges Limitations: on 4in step Lateral Step Up: Left;15 reps;Hand Hold: 1;Step Height: 4" Forward Step Up: Left;15 reps;Hand Hold: 0;Step Height: 6" Step Down: Left;15 reps;Hand Hold: 0;Step Height: 4" Functional Squat: 5 reps;Limitations Functional Squat Limitations: squat reach matrix with 3#  Modalities Modalities: Cryotherapy Manual Therapy Manual Therapy: Myofascial release Myofascial Release: MFR to quad region with main focus on medial region of incision, gastroc, hamstrings Cryotherapy Number Minutes Cryotherapy: 10 Minutes Cryotherapy Location: Knee Type of Cryotherapy: Ice pack  Physical Therapy Assessment and Plan PT Assessment and Plan Clinical Impression Statement: Continued sessopm focus on improving functional strengthening to increase ease with stairs.  Able to reduce  step height with lunges to increase loading on Lt knee.  Began squat reqch matrix to improve loading for strengthening.  Increased ease noted with stap ups today with no HHA required.  Pt continues to have increased difficulty wth step downs due to weak eccentric quad control.  Manual techniques complete at end of session to reduce fascial restricions on quadriceps region proximal incision line and medial hamstring region.  Ice applied at end of session. pt reported knee felt better with pain scale 2-3/10. PT Plan: Continue with current POC.  Begin cybex quad machine with focus on eccentric control lowering to improve steping down steps.      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 PT Long Term Goal 1 - Progress: Progressing toward goal PT Long Term Goal 2: patient will be able to squat and half knee to floor and lft 20lb so patient can demonstrate ability to perform all heavy at home chores. PT Long Term Goal 2 - Progress: Progressing toward goal  Problem List Patient Active Problem List   Diagnosis Date Noted  . S/P left knee arthroscopy 07/18/2013  . Medial meniscus, posterior horn derangement 07/18/2013  . Chondromalacia of knee 06/21/2013  .  OA (osteoarthritis) of knee 06/21/2013  . Left leg weakness 02/14/2013   . Stiffness of joint, not elsewhere classified, lower leg 02/14/2013  . Knee MCL sprain 02/02/2013  . Acute medial meniscal tear 01/18/2013  . Derangement of posterior horn of medial meniscus 01/18/2013    PT - End of Session Activity Tolerance: Patient tolerated treatment well General Behavior During Therapy: [facility] for tasks assessed/performed  GP    Juel Burrow 10/03/2013, 3:44 PM

## 2013-10-05 ENCOUNTER — Ambulatory Visit (HOSPITAL_COMMUNITY): Payer: BC Managed Care – PPO

## 2013-10-09 ENCOUNTER — Ambulatory Visit (HOSPITAL_COMMUNITY): Payer: BC Managed Care – PPO | Admitting: Physical Therapy

## 2013-10-09 ENCOUNTER — Telehealth (HOSPITAL_COMMUNITY): Payer: Self-pay

## 2013-10-11 ENCOUNTER — Ambulatory Visit (HOSPITAL_COMMUNITY): Payer: BC Managed Care – PPO | Admitting: Physical Therapy

## 2013-10-16 ENCOUNTER — Ambulatory Visit (HOSPITAL_COMMUNITY): Payer: BC Managed Care – PPO

## 2013-10-17 ENCOUNTER — Encounter: Payer: Self-pay | Admitting: Orthopedic Surgery

## 2013-10-17 ENCOUNTER — Ambulatory Visit (INDEPENDENT_AMBULATORY_CARE_PROVIDER_SITE_OTHER): Payer: BC Managed Care – PPO | Admitting: Orthopedic Surgery

## 2013-10-17 DIAGNOSIS — Z9889 Other specified postprocedural states: Secondary | ICD-10-CM

## 2013-10-17 DIAGNOSIS — M1712 Unilateral primary osteoarthritis, left knee: Secondary | ICD-10-CM

## 2013-10-17 DIAGNOSIS — M23322 Other meniscus derangements, posterior horn of medial meniscus, left knee: Secondary | ICD-10-CM

## 2013-10-17 DIAGNOSIS — M23329 Other meniscus derangements, posterior horn of medial meniscus, unspecified knee: Secondary | ICD-10-CM

## 2013-10-17 DIAGNOSIS — M1710 Unilateral primary osteoarthritis, unspecified knee: Secondary | ICD-10-CM

## 2013-10-17 MED ORDER — HYDROCODONE-ACETAMINOPHEN 7.5-325 MG PO TABS: 1.0000 | ORAL_TABLET | ORAL | Status: DC | PRN

## 2013-10-17 NOTE — Patient Instructions (Signed)
Contact workers comp for [employer] injection   OOW 4 weeks   Return 4  Weeks

## 2013-10-17 NOTE — Progress Notes (Signed)
Chief Complaint  Patient presents with  . Follow-up    4 week recheck Left knee, DOS 06/01/13    43 year old male who has had knee surgery I went back and looked at his arthroscopic imaging and operative note is grade 4 chondral changes in his patellofemoral joint grade 2 changes over his medial femoral condyle  Review of systems blood pressure more controlled with compliance with medication   He has difficulty with stair climbing and loading his knee when going up the stairs sequentially  He can walk for a short distance without discomfort and then we'll get some swelling  He still tender over the medial femoral condyle medial joint line and patellofemoral joint especially the medial facet and medial trochlea  Knee flexion is returned preop status. He remained stable. Neurovascular exam is intact  I think he would benefit from Orthovisc injections so we will try to get that approved he will continue exercising and out of work will be extended an additional 4 weeks  Status post left knee arthroscopy Osteoarthritis left knee Meniscal derangement medial left knee

## 2013-10-18 ENCOUNTER — Telehealth (HOSPITAL_COMMUNITY): Payer: Self-pay

## 2013-10-18 ENCOUNTER — Ambulatory Visit (HOSPITAL_COMMUNITY): Payer: BC Managed Care – PPO | Admitting: Physical Therapy

## 2013-10-23 ENCOUNTER — Ambulatory Visit (HOSPITAL_COMMUNITY): Payer: BC Managed Care – PPO | Admitting: Physical Therapy

## 2013-10-25 ENCOUNTER — Ambulatory Visit (HOSPITAL_COMMUNITY): Payer: BC Managed Care – PPO | Admitting: Physical Therapy

## 2013-11-14 ENCOUNTER — Ambulatory Visit (INDEPENDENT_AMBULATORY_CARE_PROVIDER_SITE_OTHER): Payer: BC Managed Care – PPO | Admitting: Orthopedic Surgery

## 2013-11-14 ENCOUNTER — Encounter: Payer: Self-pay | Admitting: Orthopedic Surgery

## 2013-11-14 VITALS — BP 134/87 | Ht 70.0 in | Wt 379.0 lb

## 2013-11-14 DIAGNOSIS — Z9889 Other specified postprocedural states: Secondary | ICD-10-CM

## 2013-11-14 NOTE — Patient Instructions (Signed)
Call to arrange therapy  Out of work 4 weeks

## 2013-11-14 NOTE — Progress Notes (Signed)
Chief Complaint  Patient presents with  . Follow-up    4 week recheck Left knee    Orthovisc has been improved.  He continues to make improvements in his left knee. 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 We will like to get his Orthovisc injections completed prior to his return which should take about 4 weeks  All see him on Thursday he is scheduled to get the medicine on Wednesday.

## 2013-11-16 ENCOUNTER — Ambulatory Visit (INDEPENDENT_AMBULATORY_CARE_PROVIDER_SITE_OTHER): Payer: BC Managed Care – PPO | Admitting: Orthopedic Surgery

## 2013-11-16 VITALS — Ht 70.0 in | Wt 379.0 lb

## 2013-11-16 DIAGNOSIS — Z9889 Other specified postprocedural states: Secondary | ICD-10-CM

## 2013-11-16 DIAGNOSIS — M1712 Unilateral primary osteoarthritis, left knee: Secondary | ICD-10-CM

## 2013-11-16 DIAGNOSIS — M1710 Unilateral primary osteoarthritis, unspecified knee: Secondary | ICD-10-CM

## 2013-11-16 NOTE — Progress Notes (Signed)
Aspiration/Injection Procedure Note [name] Walters [id number] Oct 07, 1970  Procedure: Injection Indications: arthritis  Procedure Details Consent: Risks of procedure as well as the alternatives and risks of each were explained to the (patient/caregiver).  Consent for procedure obtained. Time Out: Verified patient identification, verified procedure, site/side was marked, verified correct patient position, special equipment/implants available, medications/allergies/relevent history reviewed, required imaging and test results available.  Performed   Local Anesthesia Used:Ethyl Chloride Spray  Orthovisc injection 1 vial A sterile dressing was applied.  Patient did tolerate procedure well.   [name] Walters 11/16/2013, 10:50 AM

## 2013-11-22 ENCOUNTER — Ambulatory Visit (HOSPITAL_COMMUNITY)
Admission: RE | Admit: 2013-11-22 | Discharge: 2013-11-22 | Disposition: A | Discharge status: Home / Self Care | Payer: BC Managed Care – PPO | Source: Ambulatory Visit | Attending: Orthopedic Surgery | Admitting: Orthopedic Surgery

## 2013-11-22 DIAGNOSIS — M25669 Stiffness of unspecified knee, not elsewhere classified: Secondary | ICD-10-CM | POA: Insufficient documentation

## 2013-11-22 DIAGNOSIS — M6281 Muscle weakness (generalized): Secondary | ICD-10-CM | POA: Insufficient documentation

## 2013-11-22 DIAGNOSIS — M25569 Pain in unspecified knee: Secondary | ICD-10-CM | POA: Insufficient documentation

## 2013-11-22 DIAGNOSIS — M25469 Effusion, unspecified knee: Secondary | ICD-10-CM | POA: Diagnosis not present

## 2013-11-22 DIAGNOSIS — R262 Difficulty in walking, not elsewhere classified: Secondary | ICD-10-CM | POA: Insufficient documentation

## 2013-11-22 DIAGNOSIS — M25562 Pain in left knee: Secondary | ICD-10-CM

## 2013-11-22 DIAGNOSIS — IMO0001 Reserved for inherently not codable concepts without codable children: Secondary | ICD-10-CM | POA: Diagnosis not present

## 2013-11-22 NOTE — Evaluation (Signed)
sbnr 

## 2013-11-22 NOTE — Evaluation (Signed)
Physical Therapy Evaluation  Patient Details  Name: Unnamed Zeien MRN: [id number] Date of Birth: 18-Mar-1970  Today's Date: 11/22/2013 Time: 6578-4696 PT Time Calculation (min): 35 min     Charges: 1 Eval, TE 1000-1015         Visit#: 1 of 6  Re-eval: 12/22/13 Assessment Diagnosis: Lt knee cartilage injection Next MD Visit: Romeo Apple 10/17/2013 Prior Therapy: yes, decreased pain.   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 Atrial fibrillation    Past Surgical History:  Past Surgical History  Procedure Laterality Date  . Patellar tendon repair Bilateral 2005Romeo Apple  . Knee arthroscopy with medial menisectomy Left 06/21/2013    Procedure: LEFT KNEE ARTHROSCOPY WITH MEDIAL MENISECTOMY WITH CHONDROPLASTY, MEDIAL FEMORAL CHONDYLE AND PATELLA;  Surgeon: Vickki Hearing, MD;  Location: [facility];  Service: Orthopedics;  Laterality: Left;    Subjective Symptoms/Limitations Symptoms: Patient notes improved pain, pain mostly with excess walking and steps, especially with going up steps Pertinent History: Patient recent knee arthroscopic surgery to repair Lt knee meniscus on 06/21/13. History of bilateral patellar rupture 2004. Patient recently had a cartilage injection on 11/16/13 to decrease knee pain.  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   Cognition/Observation Observation/Other  Assessments Observations: FOTO66% limited Other Assessments: Gait: decreased weight bearing on Rt, Lt LE excess toe out, limited internal rotation, ad limited heel eversion   Sensation/Coordination/Flexibility/Functional Tests Flexibility Thomas: Positive Obers: Positive 90/90: Negative Functional Tests Functional Tests: Ely's test positive  Assessment RLE AROM (degrees) Right Knee Extension: 0 Right Knee Flexion: 125 RLE Strength Right Hip Flexion: 5/5 Right Hip Extension: 4/5 (was 4/5) Right Hip External Rotation : 40 (was 32) Right Hip Internal Rotation : 24 (was 23) Right Hip ABduction: 4/5 (was 3+/5) Right Hip ADduction: 4/5 (was 3+/5) Right Knee Flexion: 4/5 Right Knee Extension: 5/5 LLE AROM (degrees) Left Hip External Rotation : 40 Left Hip Internal Rotation : 25 Left Knee Extension: 0 Left Knee Flexion: 123 Left Ankle Dorsiflexion: 10 LLE Strength Left Hip Flexion: 5/5 Left Knee Flexion:  (4-/5) Left Knee Extension: 4/5 Left Ankle Dorsiflexion: 5/5  Exercise/Treatments Stretches Active Hamstring Stretch: Limitations Active Hamstring Stretch Limitations: 3 directions on 10in step Hip Flexor Stretch: Limitations Hip Flexor Stretch Limitations: 3way to chair 2x 20seconds ITB Stretch: Limitations ITB Stretch Limitations: 8" box 10x 3second hold Gastroc Stretch: 3 reps;30 seconds;Limitations Gastroc Stretch Limitations: slant board Standing Other Standing Knee Exercises: 3D hip Excursions 10x  Physical Therapy Assessment and Plan PT Assessment and Plan Clinical Impression Statement: Patient returns to Physical therapy following cartilage injection into Lt knee. Patient displays abnormal gait, difficulty with full weight bearign due to pain, and weakness in Lt LE follwoing surgery. Patient will benefit from skille dphysial therapy to address the above listed limitations and returnt o work withotu pain.  Pt will benefit from skilled therapeutic intervention in  order to improve on the following deficits: Abnormal gait;Decreased activity tolerance;Decreased balance;Decreased range  of motion;Decreased mobility;Difficulty walking;Decreased strength;Impaired flexibility;Pain;Obesity;Increased fascial restricitons;Increased edema Rehab Potential: Excellent PT Frequency: Min 1X/week PT Duration: 6 weeks PT Plan: Stretches were review3ed this session tro improve gait. remaining sessions to focus on improvign gait, squat depth, and stair ambualtion with use of functional strenghtieng/mobility exercises. Use of manual therapy as needed to decrease main and increase mobility.     Goals Home Exercise Program Pt/caregiver will Perform Home Exercise Program: For increased ROM;For increased strengthening PT Goal: Perform Home Exercise Program - Progress: Progressing toward goal PT Short Term Goals Time to Complete Short Term Goals: 3 weeks PT Short Term Goal 1: Patient will be able to walk 30minutes  with pain <4/10 PT Short Term Goal 2: Patient will be independent with home stretching program PT Short Term Goal 3: Patient will be able to stand >30 minutes with pain <3/10 so patient can prepare meals for self PT Short Term Goal 4: Patient will demosntrate increased glute/hamstring strength to 4/5 MMT to be able to perform sit to stand withtou UE assist PT Long Term Goals PT Long Term Goal 1: patient will demosntrate a negative Ely/obers test indicating increased hip mobility to normalize gait PT Long Term Goal 2: Patient will be able to demonstrate increase knee flexion/extesnsion strength of 4+/5 to be able to ambualte up and down a flight of stairs without UE assist Long Term Goal 3: patient will be able to demosntrate increased hip abduction strength of 4/5 MMT to able to stand on LE >20 seconds  Problem List Patient Active Problem List   Diagnosis Date Noted  . Difficulty in walking(719.7) 11/22/2013  . Pain in joint, lower leg 11/22/2013  . Muscle weakness  (generalized) 11/22/2013  . S/P left knee arthroscopy 07/18/2013  . Medial meniscus, posterior horn derangement 07/18/2013  . Chondromalacia of knee 06/21/2013  . OA (osteoarthritis) of knee 06/21/2013  . Left leg weakness 02/14/2013  . Stiffness of joint, not elsewhere classified, lower leg 02/14/2013  . Knee MCL sprain 02/02/2013  . Acute medial meniscal tear 01/18/2013  . Derangement of posterior horn of medial meniscus 01/18/2013    PT - End of Session Activity Tolerance: Patient tolerated treatment well General Behavior During Therapy: [facility] for tasks assessed/performed  GP    DeWitt, Cash R 11/22/2013, 10:27 AM  Physician Documentation Your signature is required to indicate approval of the treatment plan as stated above.  Please sign and either send electronically or make a copy of this report for your files and return this physician signed original.   Please mark one 1.__approve of plan  2. ___approve of plan with the following conditions.   ______________________________                                                          _____________________ Physician Signature  Date  

## 2013-11-23 ENCOUNTER — Ambulatory Visit (INDEPENDENT_AMBULATORY_CARE_PROVIDER_SITE_OTHER): Payer: BC Managed Care – PPO | Admitting: Orthopedic Surgery

## 2013-11-23 ENCOUNTER — Encounter: Payer: Self-pay | Admitting: Orthopedic Surgery

## 2013-11-23 VITALS — BP 133/78 | Ht 70.0 in | Wt 379.0 lb

## 2013-11-23 DIAGNOSIS — Z9889 Other specified postprocedural states: Secondary | ICD-10-CM

## 2013-11-23 DIAGNOSIS — M1712 Unilateral primary osteoarthritis, left knee: Secondary | ICD-10-CM

## 2013-11-23 NOTE — Progress Notes (Signed)
Chief Complaint  Patient presents with  . Injections    orthovisc injection #2 Left knee   Knee  Injection Procedure Note  Pre-operative Diagnosis: left knee oa   Post-operative Diagnosis: same  Indications: Symptom relief from osteoarthritis  Anesthesia: not required ethyl chloride spray used  Procedure Details  The knee was cleaned with alcohol and sprayed with ethyl chloride. 1 bile of Orthovisc was injected    Complications:  None; patient tolerated the procedure well.

## 2013-11-30 ENCOUNTER — Encounter: Payer: Self-pay | Admitting: Orthopedic Surgery

## 2013-11-30 ENCOUNTER — Ambulatory Visit (INDEPENDENT_AMBULATORY_CARE_PROVIDER_SITE_OTHER): Payer: BC Managed Care – PPO | Admitting: Orthopedic Surgery

## 2013-11-30 VITALS — Ht 70.0 in | Wt 379.0 lb

## 2013-11-30 DIAGNOSIS — M1712 Unilateral primary osteoarthritis, left knee: Secondary | ICD-10-CM

## 2013-12-01 ENCOUNTER — Encounter: Payer: Self-pay | Admitting: Orthopedic Surgery

## 2013-12-01 NOTE — Progress Notes (Signed)
Chief Complaint  Patient presents with  . Injections    orthovisc #3 Lt knee   Ht 5\' 10"  (1.778 m)  Wt 379 lb (171.913 kg)  BMI 54.38 kg/m2  Knee  Injection Procedure Note  Pre-operative Diagnosis: left knee oa   Post-operative Diagnosis: same  Indications: Symptom relief from osteoarthritis  Anesthesia: not required ethyl chloride spray used  Procedure Details  The knee was cleaned with alcohol and sprayed with ethyl chloride. 1 bile of Orthovisc was injected    Complications:  None; patient tolerated the procedure well.

## 2013-12-06 ENCOUNTER — Ambulatory Visit (HOSPITAL_COMMUNITY)
Admission: RE | Admit: 2013-12-06 | Discharge: 2013-12-06 | Disposition: A | Discharge status: Home / Self Care | Payer: BC Managed Care – PPO | Source: Ambulatory Visit | Attending: Orthopedic Surgery | Admitting: Orthopedic Surgery

## 2013-12-06 DIAGNOSIS — M25569 Pain in unspecified knee: Secondary | ICD-10-CM | POA: Insufficient documentation

## 2013-12-06 DIAGNOSIS — M6281 Muscle weakness (generalized): Secondary | ICD-10-CM | POA: Diagnosis not present

## 2013-12-06 DIAGNOSIS — R262 Difficulty in walking, not elsewhere classified: Secondary | ICD-10-CM | POA: Insufficient documentation

## 2013-12-06 NOTE — Progress Notes (Signed)
Physical Therapy Treatment Patient Details  Name: [name] Walters MRN: [id number] Date of Birth: 16-Aug-1970  Today's Date: 12/06/2013 Time: 1300-1345 PT Time Calculation (min): 45 min Charges:  therex 42  Subjective: Symptoms/Limitations Symptoms: Pt has not been to therapy since 9/30.  Pt states he got his third cartilage injection last Thursday in Lt knee.  States his pain is 5/10 with activity, 2/10 at rest Lt knee.   States he goes back 11/5 to discuss further need or other options.   Exercise/Treatments Stretches Active Hamstring Stretch: Limitations Active Hamstring Stretch Limitations: 3 directions on 10in step Hip Flexor Stretch: Limitations Hip Flexor Stretch Limitations: 3way to chair 2x 20seconds ITB Stretch: Limitations ITB Stretch Limitations: 8" box 10x 3second hold Gastroc Stretch: 3 reps;30 seconds;Limitations Gastroc Stretch Limitations: slant board Standing Forward Lunges: Left;Limitations;15 reps Forward Lunges Limitations: on 6in step Side Lunges: Both;15 reps;Limitations Side Lunges Limitations: on 6in step Lateral Step Up: Left;10 reps;Hand Hold: 1;Step Height: 4" Forward Step Up: Left;10 reps;Hand Hold: 0;Step Height: 6" Functional Squat: 10 seconds     Physical Therapy Assessment and Plan PT Assessment and Plan Clinical Impression Statement: Patient returns to Physical therapy following third cartilage injection into Lt knee. Pt has not been to therapy X 2 weeks as he states he was unaware he had appointments.    Pt with continued difficulty full WB due to pain in Lt LE.  Resumed exercises, however decreased step height and reps due to extended time without actvitiy.  Noted fatigue and weakness with therapy tdoay.  Encouraged patient to keep scheduled appointments and resume HEP.  Pt verbalized understanding. PT Plan: Continue X 2 more weeks to progress towards goals.     Problem List Patient Active Problem List   Diagnosis Date Noted  . Difficulty in  walking(719.7) 11/22/2013  . Pain in joint, lower leg 11/22/2013  . Muscle weakness (generalized) 11/22/2013  . S/P left knee arthroscopy 07/18/2013  . Medial meniscus, posterior horn derangement 07/18/2013  . Chondromalacia of knee 06/21/2013  . OA (osteoarthritis) of knee 06/21/2013  . Left leg weakness 02/14/2013  . Stiffness of joint, not elsewhere classified, lower leg 02/14/2013  . Knee MCL sprain 02/02/2013  . Acute medial meniscal tear 01/18/2013  . Derangement of posterior horn of medial meniscus 01/18/2013     Lurena NidaAmy B Frazier, PTA/CLT 12/06/2013, 5:27 PM

## 2013-12-13 ENCOUNTER — Ambulatory Visit (HOSPITAL_COMMUNITY)
Admission: RE | Admit: 2013-12-13 | Discharge: 2013-12-13 | Disposition: A | Discharge status: Home / Self Care | Payer: BC Managed Care – PPO | Source: Ambulatory Visit | Attending: Family Medicine | Admitting: Family Medicine

## 2013-12-13 DIAGNOSIS — R262 Difficulty in walking, not elsewhere classified: Secondary | ICD-10-CM | POA: Diagnosis not present

## 2013-12-13 NOTE — Progress Notes (Signed)
Physical Therapy Treatment Patient Details  Name: [name] Walters MRN: [id number] Date of Birth: [birth year]  Today's Date: 12/13/2013 Time: 0930-1018 PT Time Calculation (min): 48 min Charge: TE 0930-1008, Ice 1008-1018  Visit#: 3 of 6  Re-eval: 12/22/13 Assessment Diagnosis: Lt knee cartilage injection Next MD Visit: Romeo AppleHarrison 12/28/2013 Prior Therapy: yes, decreased pain.   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  Authorization Visit#: 3 of 6   Subjective: Symptoms/Limitations Symptoms: Knee is feeling good today, pain scale minimal 2/10 today Pain Assessment Currently in Pain?: Yes Pain Score: 2  Pain Location: Knee Pain Orientation: Left  Objective:   Exercise/Treatments Stretches Active Hamstring Stretch: Limitations Active Hamstring Stretch Limitations: 3 directions on 14in step and supine with rope Quad Stretch: 3 reps;30 seconds;Limitations Quad Stretch Limitations: prone with rope Hip Flexor Stretch: 3 reps;30 seconds;Limitations Hip Flexor Stretch Limitations: 3way 14in box ITB Stretch: 3 reps;30 seconds;Limitations ITB Stretch Limitations: 8in box Gastroc Stretch: 3 reps;30 seconds;Limitations Gastroc Stretch Limitations: slant board Standing Forward Lunges: Both;15 reps;Limitations Forward Lunges Limitations: on 6in step Side Lunges: Both;15 reps;Limitations Side Lunges Limitations: on 6in step Lateral Step Up: Left;15 reps;Hand Hold: 0;Step Height: 4" Forward Step Up: Left;15 reps;Hand Hold: 0;Step Height: 6" Step Down: Left;15 reps;Hand Hold: 0;Step Height: 4" Functional Squat: 5 sets;Limitations Functional Squat Limitations: squat reach matrix with 3# Other Standing Knee Exercises: sumo walking 2RT with green tband  Modalities Modalities: Cryotherapy Cryotherapy Number Minutes Cryotherapy: 10 Minutes Cryotherapy Location: Knee Type of Cryotherapy: Ice pack  Physical Therapy Assessment and Plan PT  Assessment and Plan Clinical Impression Statement: Progressed strengthening exercises with step down to improve eccentric quad control and sumo walking for gluteal medius strengthening.  Pt able to demonstrate appropriate technique with all activiteis following initial demo and cueing for technique.  Pt limited by fatigue and stated pain increased to 4/10 following new activities.  Ended with ice for pain and edema control. PT Plan: Continue wth current PT POC for 3 more sessions.      Goals PT Short Term Goals PT Short Term Goal 1: Patient will be able to walk 30minutes  with pain <4/10 PT Short Term Goal 1 - Progress: Progressing toward goal PT Short Term Goal 3: Patient will be able to stand >30 minutes with pain <3/10 so patient can prepare meals for self PT Short Term Goal 3 - Progress: Progressing toward goal PT Short Term Goal 4: Patient will demosntrate increased glute/hamstring strength to 4/5 MMT to be able to perform sit to stand withtou UE assist PT Short Term Goal 4 - Progress: Progressing toward goal PT Long Term Goals PT Long Term Goal 1: patient will demosntrate a negative Ely/obers test indicating increased hip mobility to normalize gait PT Long Term Goal 2: Patient will be able to demonstrate increase knee flexion/extesnsion strength of 4+/5 to be able to ambualte up and down a flight of stairs without UE assist PT Long Term Goal 2 - Progress: Progressing toward goal Long Term Goal 3: patient will be able to demosntrate increased hip abduction strength of 4/5 MMT to able to stand on LE >20 seconds Long Term Goal 3 Progress: Progressing toward goal  Problem List Patient Active Problem List   Diagnosis Date Noted  . Difficulty in walking(719.7) 11/22/2013  . Pain in joint, lower leg 11/22/2013  . Muscle weakness (generalized) 11/22/2013  . S/P left knee arthroscopy 07/18/2013  . Medial meniscus, posterior horn derangement 07/18/2013  . Chondromalacia of knee 06/21/2013  .  OA (osteoarthritis) of knee 06/21/2013  . Left leg weakness 02/14/2013  . Stiffness of joint, not elsewhere classified, lower leg 02/14/2013  . Knee MCL sprain 02/02/2013  . Acute medial meniscal tear 01/18/2013  . Derangement of posterior horn of medial meniscus 01/18/2013    PT - End of Session Activity Tolerance: Patient tolerated treatment well General Behavior During Therapy: [facility] for tasks assessed/performed  GP    Juel BurrowCockerham, Casey Jo 12/13/2013, 10:13 AM

## 2013-12-20 ENCOUNTER — Ambulatory Visit (HOSPITAL_COMMUNITY): Payer: BC Managed Care – PPO | Admitting: Physical Therapy

## 2013-12-20 ENCOUNTER — Telehealth (HOSPITAL_COMMUNITY): Payer: Self-pay

## 2013-12-20 NOTE — Telephone Encounter (Signed)
cx - taking mom to the dr

## 2013-12-26 ENCOUNTER — Ambulatory Visit (HOSPITAL_COMMUNITY): Payer: BC Managed Care – PPO | Attending: Orthopedic Surgery

## 2013-12-26 DIAGNOSIS — M25569 Pain in unspecified knee: Secondary | ICD-10-CM | POA: Insufficient documentation

## 2013-12-26 DIAGNOSIS — M6281 Muscle weakness (generalized): Secondary | ICD-10-CM | POA: Insufficient documentation

## 2013-12-26 DIAGNOSIS — R262 Difficulty in walking, not elsewhere classified: Secondary | ICD-10-CM | POA: Insufficient documentation

## 2013-12-28 ENCOUNTER — Encounter: Payer: Self-pay | Admitting: Orthopedic Surgery

## 2013-12-28 ENCOUNTER — Ambulatory Visit (INDEPENDENT_AMBULATORY_CARE_PROVIDER_SITE_OTHER): Payer: BC Managed Care – PPO | Admitting: Orthopedic Surgery

## 2013-12-28 VITALS — BP 125/87 | Ht 70.0 in | Wt 379.0 lb

## 2013-12-28 DIAGNOSIS — M1712 Unilateral primary osteoarthritis, left knee: Secondary | ICD-10-CM

## 2013-12-28 DIAGNOSIS — Z9889 Other specified postprocedural states: Secondary | ICD-10-CM

## 2013-12-28 DIAGNOSIS — M23322 Other meniscus derangements, posterior horn of medial meniscus, left knee: Secondary | ICD-10-CM

## 2013-12-28 NOTE — Patient Instructions (Signed)
No work till Jan 4th   Continue therapy

## 2013-12-28 NOTE — Progress Notes (Signed)
Patient ID: [name] Walters, male   DOB: [birth year], 43 y.o.   MRN: [id number]  Chief Complaint  Patient presents with  . Follow-up    4 week recheck left knee s/p orthovisc    Encounter Diagnoses  Name Primary?  . Primary osteoarthritis of left knee Yes  . S/P left knee arthroscopy   . Derangement of posterior horn of medial meniscus, left     The Orthovisc medications help the patient significantly his only problem now is that he can't negotiate the stairs in a sequential manner. 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  Recommend one month twice a week therapy to regain strength and control of his quadriceps and then return to work January 4  Time spent 15 minutes for counseling and planning of. Further course of action

## 2014-01-03 ENCOUNTER — Ambulatory Visit (HOSPITAL_COMMUNITY): Payer: BC Managed Care – PPO | Admitting: Physical Therapy

## 2014-01-10 ENCOUNTER — Ambulatory Visit (HOSPITAL_COMMUNITY): Payer: BC Managed Care – PPO

## 2014-01-17 ENCOUNTER — Ambulatory Visit (HOSPITAL_COMMUNITY): Payer: BC Managed Care – PPO | Admitting: Physical Therapy

## 2014-02-21 ENCOUNTER — Encounter (HOSPITAL_COMMUNITY): Payer: Self-pay | Admitting: *Deleted

## 2014-02-21 ENCOUNTER — Inpatient Hospital Stay (HOSPITAL_COMMUNITY)
Admission: EM | Admit: 2014-02-21 | Discharge: 2014-02-22 | DRG: 309 | Disposition: A | Discharge status: Home / Self Care | Payer: BC Managed Care – PPO | Attending: Cardiology | Admitting: Cardiology

## 2014-02-21 ENCOUNTER — Emergency Department (HOSPITAL_COMMUNITY): Payer: BC Managed Care – PPO

## 2014-02-21 DIAGNOSIS — I10 Essential (primary) hypertension: Secondary | ICD-10-CM | POA: Diagnosis present

## 2014-02-21 DIAGNOSIS — G4733 Obstructive sleep apnea (adult) (pediatric): Secondary | ICD-10-CM | POA: Diagnosis present

## 2014-02-21 DIAGNOSIS — E119 Type 2 diabetes mellitus without complications: Secondary | ICD-10-CM | POA: Diagnosis present

## 2014-02-21 DIAGNOSIS — E876 Hypokalemia: Secondary | ICD-10-CM | POA: Diagnosis present

## 2014-02-21 DIAGNOSIS — R0602 Shortness of breath: Secondary | ICD-10-CM

## 2014-02-21 DIAGNOSIS — I4891 Unspecified atrial fibrillation: Secondary | ICD-10-CM | POA: Diagnosis present

## 2014-02-21 DIAGNOSIS — Z6841 Body Mass Index (BMI) 40.0 and over, adult: Secondary | ICD-10-CM

## 2014-02-21 DIAGNOSIS — I480 Paroxysmal atrial fibrillation: Principal | ICD-10-CM | POA: Diagnosis present

## 2014-02-21 HISTORY — DX: Cardiac arrhythmia, unspecified: I49.9

## 2014-02-21 LAB — CBG MONITORING, ED: GLUCOSE-CAPILLARY: 103 mg/dL — AB (ref 70–99)

## 2014-02-21 LAB — TSH: TSH: 3.739 u[IU]/mL (ref 0.350–4.500)

## 2014-02-21 LAB — CBC WITH DIFFERENTIAL/PLATELET
BASOS ABS: 0 10*3/uL (ref 0.0–0.1)
BASOS ABS: 0 10*3/uL (ref 0.0–0.1)
BASOS PCT: 0 % (ref 0–1)
Basophils Relative: 0 % (ref 0–1)
EOS ABS: 0.1 10*3/uL (ref 0.0–0.7)
EOS PCT: 1 % (ref 0–5)
Eosinophils Absolute: 0.3 10*3/uL (ref 0.0–0.7)
Eosinophils Relative: 4 % (ref 0–5)
HCT: 42.2 % (ref 39.0–52.0)
HEMATOCRIT: 39 % (ref 39.0–52.0)
HEMOGLOBIN: 12.3 g/dL — AB (ref 13.0–17.0)
Hemoglobin: 13.6 g/dL (ref 13.0–17.0)
LYMPHS ABS: 2.6 10*3/uL (ref 0.7–4.0)
Lymphocytes Relative: 28 % (ref 12–46)
Lymphocytes Relative: 27 % (ref 12–46)
Lymphs Abs: 2.8 10*3/uL (ref 0.7–4.0)
MCH: 25.7 pg — ABNORMAL LOW (ref 26.0–34.0)
MCH: 25.5 pg — AB (ref 26.0–34.0)
MCHC: 31.5 g/dL (ref 30.0–36.0)
MCHC: 32.2 g/dL (ref 30.0–36.0)
MCV: 81.4 fL (ref 78.0–100.0)
MCV: 79.2 fL (ref 78.0–100.0)
MONO ABS: 0.6 10*3/uL (ref 0.1–1.0)
MONO ABS: 0.4 10*3/uL (ref 0.1–1.0)
MONOS PCT: 6 % (ref 3–12)
Monocytes Relative: 4 % (ref 3–12)
NEUTROS ABS: 6.7 10*3/uL (ref 1.7–7.7)
NEUTROS PCT: 62 % (ref 43–77)
NEUTROS PCT: 69 % (ref 43–77)
Neutro Abs: 6 10*3/uL (ref 1.7–7.7)
PLATELETS: 276 10*3/uL (ref 150–400)
Platelets: 198 10*3/uL (ref 150–400)
RBC: 4.79 MIL/uL (ref 4.22–5.81)
RBC: 5.33 MIL/uL (ref 4.22–5.81)
RDW: 14.4 % (ref 11.5–15.5)
RDW: 14.2 % (ref 11.5–15.5)
WBC: 9.7 10*3/uL (ref 4.0–10.5)
WBC: 9.7 10*3/uL (ref 4.0–10.5)

## 2014-02-21 LAB — I-STAT CHEM 8, ED
BUN: 11 mg/dL (ref 6–23)
CHLORIDE: 111 meq/L (ref 96–112)
Calcium, Ion: 0.96 mmol/L — ABNORMAL LOW (ref 1.12–1.23)
Creatinine, Ser: 0.9 mg/dL (ref 0.50–1.35)
GLUCOSE: 126 mg/dL — AB (ref 70–99)
HCT: 39 % (ref 39.0–52.0)
HEMOGLOBIN: 13.3 g/dL (ref 13.0–17.0)
POTASSIUM: 3.2 mmol/L — AB (ref 3.5–5.1)
Sodium: 146 mmol/L — ABNORMAL HIGH (ref 135–145)
TCO2: 18 mmol/L (ref 0–100)

## 2014-02-21 LAB — COMPREHENSIVE METABOLIC PANEL
ALBUMIN: 3.3 g/dL — AB (ref 3.5–5.2)
ALT: 47 U/L (ref 0–53)
ANION GAP: 7 (ref 5–15)
AST: 34 U/L (ref 0–37)
Alkaline Phosphatase: 77 U/L (ref 39–117)
BILIRUBIN TOTAL: 0.7 mg/dL (ref 0.3–1.2)
BUN: 10 mg/dL (ref 6–23)
CO2: 25 mmol/L (ref 19–32)
CREATININE: 1.14 mg/dL (ref 0.50–1.35)
Calcium: 9.2 mg/dL (ref 8.4–10.5)
Chloride: 109 mEq/L (ref 96–112)
GFR calc Af Amer: >90 mL/min (ref >90)
GFR calc non Af Amer: 78 mL/min — ABNORMAL LOW (ref >90)
GLUCOSE: 124 mg/dL — AB (ref 70–99)
Potassium: 4.4 mmol/L (ref 3.5–5.1)
SODIUM: 141 mmol/L (ref 135–145)
Total Protein: 7.1 g/dL (ref 6.0–8.3)

## 2014-02-21 LAB — BRAIN NATRIURETIC PEPTIDE: B Natriuretic Peptide: 39.7 pg/mL (ref 0.0–100.0)

## 2014-02-21 LAB — HEPARIN LEVEL (UNFRACTIONATED): HEPARIN UNFRACTIONATED: 0.38 [IU]/mL (ref 0.30–0.70)

## 2014-02-21 LAB — PROTIME-INR
INR: 0.99 (ref 0.00–1.49)
PROTHROMBIN TIME: 13.2 s (ref 11.6–15.2)

## 2014-02-21 LAB — MAGNESIUM: Magnesium: 2.1 mg/dL (ref 1.5–2.5)

## 2014-02-21 MED ORDER — ATORVASTATIN CALCIUM 40 MG PO TABS
40.0000 mg | ORAL_TABLET | Freq: Every day | ORAL | Status: DC
  Administered 2014-02-21 – 2014-02-22 (×2): 40 mg via ORAL
  Filled 2014-02-21 (×2): qty 1

## 2014-02-21 MED ORDER — POTASSIUM CHLORIDE CRYS ER 20 MEQ PO TBCR
40.0000 meq | EXTENDED_RELEASE_TABLET | Freq: Once | ORAL | Status: AC
  Administered 2014-02-21: 40 meq via ORAL
  Filled 2014-02-21: qty 2

## 2014-02-21 MED ORDER — DILTIAZEM HCL 25 MG/5ML IV SOLN
20.0000 mg | Freq: Once | INTRAVENOUS | Status: AC
Start: 2014-02-21 — End: 2014-02-21
  Administered 2014-02-21: 20 mg via INTRAVENOUS
  Filled 2014-02-21: qty 5

## 2014-02-21 MED ORDER — METOPROLOL TARTRATE 1 MG/ML IV SOLN
5.0000 mg | INTRAVENOUS | Status: DC | PRN
  Administered 2014-02-21 (×2): 5 mg via INTRAVENOUS
  Filled 2014-02-21 (×3): qty 5

## 2014-02-21 MED ORDER — DEXTROSE 5 % IV SOLN
20.0000 mg/h | Freq: Once | INTRAVENOUS | Status: AC
  Administered 2014-02-21: 20 mg/h via INTRAVENOUS

## 2014-02-21 MED ORDER — ONDANSETRON HCL 4 MG/2ML IJ SOLN: 4.0000 mg | Freq: Four times a day (QID) | INTRAMUSCULAR | Status: DC | PRN

## 2014-02-21 MED ORDER — AMIODARONE HCL IN DEXTROSE 360-4.14 MG/200ML-% IV SOLN
30.0000 mg/h | INTRAVENOUS | Status: DC
  Administered 2014-02-21: 30 mg/h via INTRAVENOUS
  Filled 2014-02-21 (×2): qty 200

## 2014-02-21 MED ORDER — SODIUM CHLORIDE 0.9 % IV SOLN
INTRAVENOUS | Status: DC
  Administered 2014-02-21: 11:00:00 via INTRAVENOUS

## 2014-02-21 MED ORDER — ASPIRIN 300 MG RE SUPP
300.0000 mg | RECTAL | Status: AC
  Filled 2014-02-21: qty 1

## 2014-02-21 MED ORDER — AMIODARONE IV BOLUS ONLY 150 MG/100ML
150.0000 mg | Freq: Once | INTRAVENOUS | Status: AC
  Administered 2014-02-21: 150 mg via INTRAVENOUS
  Filled 2014-02-21: qty 100

## 2014-02-21 MED ORDER — AMIODARONE HCL 200 MG PO TABS
200.0000 mg | ORAL_TABLET | Freq: Two times a day (BID) | ORAL | Status: DC
  Administered 2014-02-21 – 2014-02-22 (×2): 200 mg via ORAL
  Filled 2014-02-21 (×2): qty 1

## 2014-02-21 MED ORDER — OFF THE BEAT BOOK
Freq: Once | Status: AC
  Administered 2014-02-21: 13:00:00
  Filled 2014-02-21: qty 1

## 2014-02-21 MED ORDER — AMIODARONE HCL IN DEXTROSE 360-4.14 MG/200ML-% IV SOLN
60.0000 mg/h | INTRAVENOUS | Status: AC
  Administered 2014-02-21: 60 mg/h via INTRAVENOUS
  Filled 2014-02-21: qty 200

## 2014-02-21 MED ORDER — HEPARIN (PORCINE) IN NACL 100-0.45 UNIT/ML-% IJ SOLN
1800.0000 [IU]/h | INTRAMUSCULAR | Status: DC
  Administered 2014-02-21 (×2): 1800 [IU]/h via INTRAVENOUS
  Filled 2014-02-21 (×3): qty 250

## 2014-02-21 MED ORDER — ASPIRIN 81 MG PO CHEW
324.0000 mg | CHEWABLE_TABLET | ORAL | Status: AC
  Filled 2014-02-21: qty 4

## 2014-02-21 MED ORDER — ASPIRIN EC 81 MG PO TBEC
81.0000 mg | DELAYED_RELEASE_TABLET | Freq: Every day | ORAL | Status: DC
  Administered 2014-02-22: 81 mg via ORAL
  Filled 2014-02-21: qty 1

## 2014-02-21 MED ORDER — METOPROLOL TARTRATE 25 MG PO TABS
25.0000 mg | ORAL_TABLET | Freq: Two times a day (BID) | ORAL | Status: DC
  Administered 2014-02-21 – 2014-02-22 (×3): 25 mg via ORAL
  Filled 2014-02-21 (×3): qty 1

## 2014-02-21 MED ORDER — HEPARIN BOLUS VIA INFUSION
5000.0000 [IU] | Freq: Once | INTRAVENOUS | Status: AC
  Administered 2014-02-21: 5000 [IU] via INTRAVENOUS
  Filled 2014-02-21: qty 5000

## 2014-02-21 MED ORDER — NITROGLYCERIN 0.4 MG SL SUBL: 0.4000 mg | SUBLINGUAL_TABLET | SUBLINGUAL | Status: DC | PRN

## 2014-02-21 MED ORDER — RIVAROXABAN 20 MG PO TABS
20.0000 mg | ORAL_TABLET | Freq: Every day | ORAL | Status: DC
Start: 2014-02-21 — End: 2014-02-22
  Administered 2014-02-21 – 2014-02-22 (×2): 20 mg via ORAL
  Filled 2014-02-21 (×2): qty 1

## 2014-02-21 MED ORDER — AMIODARONE LOAD VIA INFUSION
150.0000 mg | Freq: Once | INTRAVENOUS | Status: AC
  Administered 2014-02-21: 150 mg via INTRAVENOUS
  Filled 2014-02-21: qty 83.34

## 2014-02-21 MED ORDER — ACETAMINOPHEN 325 MG PO TABS: 650.0000 mg | ORAL_TABLET | ORAL | Status: DC | PRN

## 2014-02-21 NOTE — ED Notes (Signed)
Cardiology at bedside.

## 2014-02-21 NOTE — Care Management Note (Signed)
    Page 1 of 1   02/22/2014     3:14:03 PM CARE MANAGEMENT NOTE 02/22/2014  Patient:  [name] Walters,[name] Walters   Account Number:  [id number]  Date Initiated:  02/21/2014  Documentation initiated by:  GRAVES-BIGELOW,Naijah Lacek  Subjective/Objective Assessment:   Pt admitted for new onset afib RVR- initiated on IV amio gtt.     Action/Plan:   CM to monitor for disposition needs.   Anticipated DC Date:  02/23/2014   Anticipated DC Plan:  HOME/SELF CARE      DC Planning Services  CM consult      Choice offered to / List presented to:             Status of service:  Completed, signed off Medicare Important Message given?  NO (If response is "NO", the following Medicare IM given date fields will be blank) Date Medicare IM given:   Medicare IM given by:   Date Additional Medicare IM given:   Additional Medicare IM given by:    Discharge Disposition:  HOME/SELF CARE  Per UR Regulation:  Reviewed for med. necessity/level of care/duration of stay  If discussed at Long Length of Stay Meetings, dates discussed:    Comments:  02-22-14 1511 Tomi BambergerBrenda Graves-Bigelow, RN,BSN [phone] CM provided pt with the 30 day free xarelto card and year supply xarelto card. [facility] in [place] has medication available. Pt will need Rx for 30 day supply once stable for d/c.

## 2014-02-21 NOTE — Progress Notes (Signed)
ANTICOAGULATION CONSULT NOTE - Initial Consult  Pharmacy Consult for Heparin Indication: atrial fibrillation  Allergies  Allergen Reactions  . Bee Venom Itching    "bumble bee"    Patient Measurements: Height: 5\' 10"  (177.8 cm) Weight: (!) 372 lb (168.738 kg) IBW/kg (Calculated) : 73  Heparin dosing weight:  115 kg  Vital Signs: Temp: 98.2 F (36.8 C) (12/30 0621) Temp Source: Oral (12/30 0621) BP: 126/90 mmHg (12/30 0645)  Labs:  Recent Labs  02/21/14 0540 02/21/14 0548  HGB 12.3* 13.3  HCT 39.0 39.0  PLT 198  --   CREATININE  --  0.90    Estimated Creatinine Clearance: 170 mL/min (by C-G formula based on Cr of 0.9).   Medical History: Past Medical History  Diagnosis Date  . Atrial fibrillation     Medications:  ASA  Atenolol  Norco  Ibuprofen  MVI  Percocet  Phenergan  Assessment: 43 yo male with Afib for heparin   Goal of Therapy:  Heparin level 0.3-0.7 units/ml Monitor platelets by anticoagulation protocol: Yes   Plan:  Heparin 5000 units IV bolus, then 1800 units/hr Check heparin level in 6 hours.  Rayann Jolley, Gary FleetGregory Vernon 02/21/2014,7:00 AM

## 2014-02-21 NOTE — Progress Notes (Signed)
Amiodarone Drug - Drug Interaction Consult Note  Recommendations:  Continue current therapy  Amiodarone is metabolized by the cytochrome P450 system and therefore has the potential to cause many drug interactions. Amiodarone has an average plasma half-life of 50 days (range 20 to 100 days).   There is potential for drug interactions to occur several weeks or months after stopping treatment and the onset of drug interactions may be slow after initiating amiodarone.   []  Statins: Increased risk of myopathy. Simvastatin- restrict dose to 20mg  daily. Other statins: counsel patients to report any muscle pain or weakness immediately.  []  Anticoagulants: Amiodarone can increase anticoagulant effect. Consider warfarin dose reduction. Patients should be monitored closely and the dose of anticoagulant altered accordingly, remembering that amiodarone levels take several weeks to stabilize.  []  Antiepileptics: Amiodarone can increase plasma concentration of phenytoin, the dose should be reduced. Note that small changes in phenytoin dose can result in large changes in levels. Monitor patient and counsel on signs of toxicity.  [x]  Beta blockers: increased risk of bradycardia, AV block and myocardial depression. Sotalol - avoid concomitant use.  []   Calcium channel blockers (diltiazem and verapamil): increased risk of bradycardia, AV block and myocardial depression.  []   Cyclosporine: Amiodarone increases levels of cyclosporine. Reduced dose of cyclosporine is recommended.  []  Digoxin dose should be halved when amiodarone is started.  []  Diuretics: increased risk of cardiotoxicity if hypokalemia occurs.  []  Oral hypoglycemic agents (glyburide, glipizide, glimepiride): increased risk of hypoglycemia. Patient's glucose levels should be monitored closely when initiating amiodarone therapy.   []  Drugs that prolong the QT interval:  Torsades de pointes risk may be increased with concurrent use - avoid if  possible.  Monitor QTc, also keep magnesium/potassium WNL if concurrent therapy can't be avoided. Marland Kitchen. Antibiotics: e.g. fluoroquinolones, erythromycin. . Antiarrhythmics: e.g. quinidine, procainamide, disopyramide, sotalol. . Antipsychotics: e.g. phenothiazines, haloperidol.  . Lithium, tricyclic antidepressants, and methadone.  Thank You,  Chelsea Aushuy D. Laney Potashang, PharmD, BCPS Pager:  [phone] 02/21/2014, 9:26 AM

## 2014-02-21 NOTE — Progress Notes (Signed)
ANTICOAGULATION CONSULT NOTE - Follow Up Consult  Pharmacy Consult for Xarelto Indication: atrial fibrillation, non valvular  Allergies  Allergen Reactions  . Bee Venom Itching    "bumble bee"    Patient Measurements: Height: 5\' 10"  (177.8 cm) Weight: (!) 372 lb 4.8 oz (168.874 kg) IBW/kg (Calculated) : 73 Heparin Dosing Weight:   Vital Signs: Temp: 97.9 F (36.6 C) (12/30 0915) Temp Source: Oral (12/30 0915) BP: 131/85 mmHg (12/30 1300) Pulse Rate: 73 (12/30 1300)  Labs:  Recent Labs  02/21/14 0540 02/21/14 0548 02/21/14 1019 02/21/14 1500  HGB 12.3* 13.3 13.6  --   HCT 39.0 39.0 42.2  --   PLT 198  --  276  --   LABPROT  --   --  13.2  --   INR  --   --  0.99  --   HEPARINUNFRC  --   --   --  0.38  CREATININE  --  0.90 1.14  --     Estimated Creatinine Clearance: 134.4 mL/min (by C-G formula based on Cr of 1.14).   Medications:  Scheduled:  . amiodarone  200 mg Oral BID  . aspirin  324 mg Oral NOW   Or  . aspirin  300 mg Rectal NOW  . [START ON 02/22/2014] aspirin EC  81 mg Oral Daily  . atorvastatin  40 mg Oral q1800  . metoprolol tartrate  25 mg Oral BID  . rivaroxaban  20 mg Oral Q supper    Assessment:  43 yr old male who was started on heparin for a.fib will now be started on Xarelto for non valvular a.fib. Heparin has been d/c'd.  Goal of Therapy:   Plan:  Md has d/c'd heparin. Pt will start on Xarelto 20 mg daily with supper for non valvular a.fib.  Eugene Garnetotter, Sherice Ijames Sue 02/21/2014,6:58 PM

## 2014-02-21 NOTE — Progress Notes (Signed)
UR Completed Shadana Pry Graves-Bigelow, RN,BSN [phone]  

## 2014-02-21 NOTE — ED Notes (Signed)
Pt. Called EMS because of chest pain. On EMS arrival pt. HR in the low 200s. EMS gave 6 of adenosine and that brought the HR down to the 170s. EMS then gave 20 of cardizem and that brought the HR down to the 150s.

## 2014-02-21 NOTE — Progress Notes (Signed)
Pt converted to NSR

## 2014-02-21 NOTE — ED Provider Notes (Signed)
CSN: [id number]     Arrival date & time 02/21/14  [id number] History   First MD Initiated Contact with Patient 02/21/14 0534     Chief Complaint  Patient presents with  . Atrial Fibrillation     (Consider location/radiation/quality/duration/timing/severity/associated sxs/prior Treatment) HPI Patient complains of rapid heartbeat awaken him from sleep 3:54 AM today. He denies any chest pain. Other associated symptoms include mild dyspnea. No sweatiness no nausea. He was brought by EMS EMS treated him with adenosine 6 mg IV followed by Cardizem 20 no grams IV which took his heart rate from 2 20 per minute to 150 per minute. He feels improved presently after treatment. No other associated symptoms. Nothing made symptoms better or worse Past Medical History  Diagnosis Date  . Atrial fibrillation    Past Surgical History  Procedure Laterality Date  . Patellar tendon repair Bilateral 2005Romeo Apple- [facility]  . Knee arthroscopy with medial menisectomy Left 06/21/2013    Procedure: LEFT KNEE ARTHROSCOPY WITH MEDIAL MENISECTOMY WITH CHONDROPLASTY, MEDIAL FEMORAL CHONDYLE AND PATELLA;  Surgeon: Vickki HearingStanley E Harrison, MD;  Location: [facility];  Service: Orthopedics;  Laterality: Left;   No family history on file. History  Substance Use Topics  . Smoking status: Never Smoker   . Smokeless tobacco: Not on file  . Alcohol Use: No    Review of Systems  Constitutional: Negative.   HENT: Negative.   Respiratory: Positive for shortness of breath.   Cardiovascular: Positive for palpitations.  Gastrointestinal: Negative.   Musculoskeletal: Negative.   Skin: Negative.   Neurological: Negative.   Psychiatric/Behavioral: Negative.   All other systems reviewed and are negative.     Allergies  Bee venom  Home Medications   Prior to Admission medications   Medication Sig Start Date End Date Taking? Authorizing Provider  aspirin EC 325 MG tablet Take 325 mg by mouth daily.    Historical Provider, MD   HYDROcodone-acetaminophen (NORCO) 7.5-325 MG per tablet Take 1 tablet by mouth every 4 (four) hours as needed for moderate pain. 10/17/13   Vickki HearingStanley E Harrison, MD  ibuprofen (ADVIL,MOTRIN) 800 MG tablet Take 800 mg by mouth daily as needed for moderate pain.    Historical Provider, MD  Omega-3 Fatty Acids (FISH OIL PO) Take 1 capsule by mouth 2 (two) times daily.    Historical Provider, MD  oxyCODONE-acetaminophen (PERCOCET/ROXICET) 5-325 MG per tablet Take 1 tablet by mouth every 4 (four) hours as needed for severe pain. 06/26/13   Vickki HearingStanley E Harrison, MD  promethazine (PHENERGAN) 12.5 MG tablet Take 1 tablet (12.5 mg total) by mouth every 6 (six) hours as needed for nausea or vomiting. 06/21/13   Vickki HearingStanley E Harrison, MD   There were no vitals taken for this visit. Physical Exam  Constitutional: He appears well-developed and well-nourished.  HENT:  Head: Normocephalic and atraumatic.  Eyes: Conjunctivae are normal. Pupils are equal, round, and reactive to light.  Neck: Neck supple. No tracheal deviation present. No thyromegaly present.  Cardiovascular:  No murmur heard. Tachycardic irregularly irregular  Pulmonary/Chest: Effort normal and breath sounds normal.  Abdominal: Soft. Bowel sounds are normal. He exhibits no distension. There is no tenderness.  Morbidly obese  Musculoskeletal: Normal range of motion. He exhibits no edema or tenderness.  Neurological: He is alert. Coordination normal.  Skin: Skin is warm and dry. No rash noted.  Psychiatric: He has a normal mood and affect.  Nursing note and vitals reviewed.   ED Course  Procedures (including critical care time) Labs Review  Labs Reviewed - No data to display  Imaging Review No results found.   EKG Interpretation   Date/Time:  Wednesday February 21 2014 05:34:55 EST Ventricular Rate:  163 PR Interval:    QRS Duration: 90 QT Interval:  290 QTC Calculation: 477 R Axis:   80 Text Interpretation:  Atrial fibrillation  Nonspecific repol abnormality,  inferior leads Borderline prolonged QT interval Baseline wander in lead(s)  III SINCE LAST TRACING HEART RATE HAS INCREASED Confirmed by Ethelda Chick   MD, Jemya Depierro [phone]) on 02/21/2014 5:44:10 AM     Valsalva maneuvers attempted without change in rate or rhythm. Intravenous Cardizem ordered. 6:10 AM heart rate approximate 1 50 bpm, atrial fibrillation after treatment with intravenous Cardizem bolus. Intravenous Cardizem drip ordered. 64 5 AM patient's heart rate remains at 135-155, atrial fibrillation. Patient states he feels well. He is in no distress. Intravenous Lopressor and amiodarone and heparin per pharmacy consult ordered after consultation with Dr.Harwani via telephone  Chest x-ray viewed by me Results for orders placed or performed during the hospital encounter of 02/21/14  CBC with Differential  Result Value Ref Range   WBC 9.7 4.0 - 10.5 K/uL   RBC 4.79 4.22 - 5.81 MIL/uL   Hemoglobin 12.3 (L) 13.0 - 17.0 g/dL   HCT 60.4 54.0 - 98.1 %   MCV 81.4 78.0 - 100.0 fL   MCH 25.7 (L) 26.0 - 34.0 pg   MCHC 31.5 30.0 - 36.0 g/dL   RDW 19.1 47.8 - 29.5 %   Platelets 198 150 - 400 K/uL   Neutrophils Relative % 62 43 - 77 %   Neutro Abs 6.0 1.7 - 7.7 K/uL   Lymphocytes Relative 28 12 - 46 %   Lymphs Abs 2.8 0.7 - 4.0 K/uL   Monocytes Relative 6 3 - 12 %   Monocytes Absolute 0.6 0.1 - 1.0 K/uL   Eosinophils Relative 4 0 - 5 %   Eosinophils Absolute 0.3 0.0 - 0.7 K/uL   Basophils Relative 0 0 - 1 %   Basophils Absolute 0.0 0.0 - 0.1 K/uL  Brain natriuretic peptide  Result Value Ref Range   B Natriuretic Peptide 39.7 0.0 - 100.0 pg/mL  CBG monitoring, ED  Result Value Ref Range   Glucose-Capillary 103 (H) 70 - 99 mg/dL  I-stat chem 8, ed  Result Value Ref Range   Sodium 146 (H) 135 - 145 mmol/L   Potassium 3.2 (L) 3.5 - 5.1 mmol/L   Chloride 111 96 - 112 mEq/L   BUN 11 6 - 23 mg/dL   Creatinine, Ser 6.21 0.50 - 1.35 mg/dL   Glucose, Bld 308 (H) 70 -  99 mg/dL   Calcium, Ion 6.57 (L) 1.12 - 1.23 mmol/L   TCO2 18 0 - 100 mmol/L   Hemoglobin 13.3 13.0 - 17.0 g/dL   HCT 84.6 96.2 - 95.2 %   Dg Chest Port 1 View  02/21/2014   CLINICAL DATA:  Shortness of breath, tachycardia.  EXAM: PORTABLE CHEST - 1 VIEW  COMPARISON:  Chest radiograph June 17, 2004  FINDINGS: Cardiac silhouette is upper limits of normal in size, likely accentuated by AP technique. Mediastinal silhouette is nonsuspicious. Pulmonary vascular congestion, no pleural effusions or focal consolidation. No pneumothorax. Large body habitus. Osseous structures are nonsuspicious.  IMPRESSION: Pulmonary vascular congestion.  Borderline cardiomegaly.   Electronically Signed   By: Awilda Metro   On: 02/21/2014 06:00    MDM  Dr. Sharyn Lull to evaluate pt in ED for inpatient  stay for rate control and rhythmm conversion Doubt congestive heart failure, low BNP. Dx#1 atrial fibrillation with rapid ventricular response #2 hypokalemia Final diagnoses:  None   CRITICAL CARE Performed by: Doug SouJACUBOWITZ,Mckaela Howley Total critical care time: 40 minute Critical care time was exclusive of separately billable procedures and treating other patients. Critical care was necessary to treat or prevent imminent or life-threatening deterioration. Critical care was time spent personally by me on the following activities: development of treatment plan with patient and/or surrogate as well as nursing, discussions with consultants, evaluation of patient's response to treatment, examination of patient, obtaining history from patient or surrogate, ordering and performing treatments and interventions, ordering and review of laboratory studies, ordering and review of radiographic studies, pulse oximetry and re-evaluation of patient's condition.     Doug SouSam Maveryck Bahri, MD 02/21/14 [phone]

## 2014-02-21 NOTE — Progress Notes (Signed)
  Echocardiogram 2D Echocardiogram has been performed.  Leta JunglingCooper, [place] 02/21/2014, 3:07 PM

## 2014-02-21 NOTE — H&P (Signed)
[name] Walters is an 43 y.o. male.   Chief Complaint: Palpitations associated with mild shortness of breath  HPI: Patient is 43 year old male with past medical history significant for hypertension, history of paroxysmal atrial fibrillation, last episode approximately one year ago, morbid obesity, came to the ER by EMS as patient woke up with sudden onset of rapid heartbeat associated with mild shortness of breath. Patient denies any chest pain nausea vomiting diaphoresis. Denies history of PND orthopnea leg swelling. States it lasts November he had similar episode spontaneously converted to normal rhythm. Patient was noted to be in A. fib with RVR on the field and received IV adenosine followed by IV Cardizem bolus 2 without much improvement in heart rate and then IV Lopressor with reduction in heart rate in 110 and was started on IV amiodarone. Patient denies any sleep studies in the past. Denies any history of rheumatic fever or thyroid problems. Denies history of focal abuse.  Past Medical History  Diagnosis Date  . Atrial fibrillation     Past Surgical History  Procedure Laterality Date  . Patellar tendon repair Bilateral 2005Romeo Apple- [facility]  . Knee arthroscopy with medial menisectomy Left 06/21/2013    Procedure: LEFT KNEE ARTHROSCOPY WITH MEDIAL MENISECTOMY WITH CHONDROPLASTY, MEDIAL FEMORAL CHONDYLE AND PATELLA;  Surgeon: Vickki HearingStanley E Harrison, MD;  Location: [facility];  Service: Orthopedics;  Laterality: Left;    History reviewed. No pertinent family history. Social History:  reports that he has never smoked. He has never used smokeless tobacco. He reports that he does not drink alcohol or use illicit drugs.  Allergies:  Allergies  Allergen Reactions  . Bee Venom Itching    "bumble bee"     (Not in a hospital admission)  Results for orders placed or performed during the hospital encounter of 02/21/14 (from the past 48 hour(s))  CBG monitoring, ED     Status: Abnormal   Collection Time:  02/21/14  5:37 AM  Result Value Ref Range   Glucose-Capillary 103 (H) 70 - 99 mg/dL  CBC with Differential     Status: Abnormal   Collection Time: 02/21/14  5:40 AM  Result Value Ref Range   WBC 9.7 4.0 - 10.5 K/uL   RBC 4.79 4.22 - 5.81 MIL/uL   Hemoglobin 12.3 (L) 13.0 - 17.0 g/dL   HCT 40.939.0 81.139.0 - 91.452.0 %   MCV 81.4 78.0 - 100.0 fL   MCH 25.7 (L) 26.0 - 34.0 pg   MCHC 31.5 30.0 - 36.0 g/dL   RDW 78.214.4 95.611.5 - 21.315.5 %   Platelets 198 150 - 400 K/uL   Neutrophils Relative % 62 43 - 77 %   Neutro Abs 6.0 1.7 - 7.7 K/uL   Lymphocytes Relative 28 12 - 46 %   Lymphs Abs 2.8 0.7 - 4.0 K/uL   Monocytes Relative 6 3 - 12 %   Monocytes Absolute 0.6 0.1 - 1.0 K/uL   Eosinophils Relative 4 0 - 5 %   Eosinophils Absolute 0.3 0.0 - 0.7 K/uL   Basophils Relative 0 0 - 1 %   Basophils Absolute 0.0 0.0 - 0.1 K/uL  Brain natriuretic peptide     Status: None   Collection Time: 02/21/14  5:40 AM  Result Value Ref Range   B Natriuretic Peptide 39.7 0.0 - 100.0 pg/mL    Comment: Please note change in reference range.  I-stat chem 8, ed     Status: Abnormal   Collection Time: 02/21/14  5:48 AM  Result Value  Ref Range   Sodium 146 (H) 135 - 145 mmol/L   Potassium 3.2 (L) 3.5 - 5.1 mmol/L   Chloride 111 96 - 112 mEq/L   BUN 11 6 - 23 mg/dL   Creatinine, Ser 1.610.90 0.50 - 1.35 mg/dL   Glucose, Bld 096126 (H) 70 - 99 mg/dL   Calcium, Ion 0.450.96 (L) 1.12 - 1.23 mmol/L   TCO2 18 0 - 100 mmol/L   Hemoglobin 13.3 13.0 - 17.0 g/dL   HCT 40.939.0 81.139.0 - 91.452.0 %   Dg Chest Port 1 View  02/21/2014   CLINICAL DATA:  Shortness of breath, tachycardia.  EXAM: PORTABLE CHEST - 1 VIEW  COMPARISON:  Chest radiograph June 17, 2004  FINDINGS: Cardiac silhouette is upper limits of normal in size, likely accentuated by AP technique. Mediastinal silhouette is nonsuspicious. Pulmonary vascular congestion, no pleural effusions or focal consolidation. No pneumothorax. Large body habitus. Osseous structures are nonsuspicious.   IMPRESSION: Pulmonary vascular congestion.  Borderline cardiomegaly.   Electronically Signed   By: Awilda Metroourtnay  Bloomer   On: 02/21/2014 06:00    Review of Systems  Constitutional: Positive for fever.  HENT: Negative for hearing loss.   Eyes: Negative for double vision.  Respiratory: Positive for shortness of breath. Negative for cough, hemoptysis and sputum production.   Cardiovascular: Positive for palpitations. Negative for chest pain, orthopnea, claudication, leg swelling and PND.  Gastrointestinal: Negative for nausea, vomiting and abdominal pain.  Genitourinary: Negative for dysuria.  Neurological: Negative for dizziness and headaches.    Blood pressure 119/62, pulse 125, temperature 98 F (36.7 C), temperature source Oral, resp. rate 18, height 5\' 10"  (1.778 m), weight 168.738 kg (372 lb), SpO2 100 %. Physical Exam  Constitutional: He is oriented to person, place, and time.  HENT:  Head: Normocephalic and atraumatic.  Eyes: Conjunctivae are normal. Left eye exhibits no discharge. No scleral icterus.  Neck: Normal range of motion. Neck supple. No JVD present. No tracheal deviation present. No thyromegaly present.  Cardiovascular:  Tachycardiac irregularly irregular S1 and S2 soft  Respiratory: Effort normal and breath sounds normal. No respiratory distress. He has no wheezes. He has no rales.  GI: Soft. Bowel sounds are normal. He exhibits distension. There is no tenderness. There is no rebound.  Musculoskeletal: He exhibits no edema or tenderness.  Neurological: He is alert and oriented to person, place, and time.     Assessment/Plan New-onset recurrent A. fib with RVR Hypertension Morbid obesity Probable obstructive sleep apnea Plan As per orders Patient will need sleep studies as outpatient  [name] Walters 02/21/2014, 7:53 AM

## 2014-02-22 LAB — LIPID PANEL
Cholesterol: 166 mg/dL (ref 0–200)
HDL: 39 mg/dL — ABNORMAL LOW (ref >39)
LDL Cholesterol: 112 mg/dL — ABNORMAL HIGH (ref 0–99)
TRIGLYCERIDES: 76 mg/dL (ref <150)
Total CHOL/HDL Ratio: 4.3 RATIO
VLDL: 15 mg/dL (ref 0–40)

## 2014-02-22 LAB — BASIC METABOLIC PANEL
Anion gap: 7 (ref 5–15)
BUN: 11 mg/dL (ref 6–23)
CALCIUM: 8.8 mg/dL (ref 8.4–10.5)
CO2: 24 mmol/L (ref 19–32)
Chloride: 107 mEq/L (ref 96–112)
Creatinine, Ser: 1.19 mg/dL (ref 0.50–1.35)
GFR calc Af Amer: 86 mL/min — ABNORMAL LOW (ref >90)
GFR calc non Af Amer: 74 mL/min — ABNORMAL LOW (ref >90)
GLUCOSE: 136 mg/dL — AB (ref 70–99)
Potassium: 4.1 mmol/L (ref 3.5–5.1)
SODIUM: 138 mmol/L (ref 135–145)

## 2014-02-22 LAB — CBC
HCT: 40.5 % (ref 39.0–52.0)
Hemoglobin: 12.7 g/dL — ABNORMAL LOW (ref 13.0–17.0)
MCH: 24.7 pg — ABNORMAL LOW (ref 26.0–34.0)
MCHC: 31.4 g/dL (ref 30.0–36.0)
MCV: 78.8 fL (ref 78.0–100.0)
Platelets: 240 10*3/uL (ref 150–400)
RBC: 5.14 MIL/uL (ref 4.22–5.81)
RDW: 14.2 % (ref 11.5–15.5)
WBC: 9.5 10*3/uL (ref 4.0–10.5)

## 2014-02-22 MED ORDER — AMIODARONE HCL 200 MG PO TABS: 200.0000 mg | ORAL_TABLET | Freq: Two times a day (BID) | ORAL | Status: AC

## 2014-02-22 MED ORDER — METOPROLOL TARTRATE 25 MG PO TABS
25.0000 mg | ORAL_TABLET | Freq: Two times a day (BID) | ORAL | Status: AC
Start: 2014-02-22 — End: ?

## 2014-02-22 MED ORDER — ATORVASTATIN CALCIUM 40 MG PO TABS: 40.0000 mg | ORAL_TABLET | Freq: Every day | ORAL | Status: AC

## 2014-02-22 MED ORDER — RIVAROXABAN 20 MG PO TABS
20.0000 mg | ORAL_TABLET | Freq: Every day | ORAL | Status: AC
Start: 2014-02-22 — End: ?

## 2014-02-22 NOTE — Discharge Instructions (Addendum)
Information on my medicine - XARELTO (Rivaroxaban)  This medication education was reviewed with me or my healthcare representative as part of my discharge preparation.  The pharmacist that spoke with me during my hospital stay was:  Lennon Alstrom, [facility]  Why was Xarelto prescribed for you? Xarelto was prescribed for you to reduce the risk of a blood clot forming that can cause a stroke if you have a medical condition called atrial fibrillation (a type of irregular heartbeat).  What do you need to know about xarelto ? Take your Xarelto ONCE DAILY at the same time every day with your evening meal. If you have difficulty swallowing the tablet whole, you may crush it and mix in applesauce just prior to taking your dose.  Take Xarelto exactly as prescribed by your doctor and DO NOT stop taking Xarelto without talking to the doctor who prescribed the medication.  Stopping without other stroke prevention medication to take the place of Xarelto may increase your risk of developing a clot that causes a stroke.  Refill your prescription before you run out.  After discharge, you should have regular check-up appointments with your healthcare provider that is prescribing your Xarelto.  In the future your dose may need to be changed if your kidney function or weight changes by a significant amount.  What do you do if you miss a dose? If you are taking Xarelto ONCE DAILY and you miss a dose, take it as soon as you remember on the same day then continue your regularly scheduled once daily regimen the next day. Do not take two doses of Xarelto at the same time or on the same day.   Important Safety Information A possible side effect of Xarelto is bleeding. You should call your healthcare provider right away if you experience any of the following: ? Bleeding from an injury or your nose that does not stop. ? Unusual colored urine (red or dark brown) or unusual colored stools (red or black). ? Unusual  bruising for unknown reasons. ? A serious fall or if you hit your head (even if there is no bleeding).  Some medicines may interact with Xarelto and might increase your risk of bleeding while on Xarelto. To help avoid this, consult your healthcare provider or pharmacist prior to using any new prescription or non-prescription medications, including herbals, vitamins, non-steroidal anti-inflammatory drugs (NSAIDs) and supplements.  This website has more information on Xarelto: VisitDestination.com.br.   Atrial Fibrillation Atrial fibrillation is a type of irregular heart rhythm (arrhythmia). During atrial fibrillation, the upper chambers of the heart (atria) quiver continuously in a chaotic pattern. This causes an irregular and often rapid heart rate.  Atrial fibrillation is the result of the heart becoming overloaded with disorganized signals that tell it to beat. These signals are normally released one at a time by a part of the right atrium called the sinoatrial node. They then travel from the atria to the lower chambers of the heart (ventricles), causing the atria and ventricles to contract and pump blood as they pass. In atrial fibrillation, parts of the atria outside of the sinoatrial node also release these signals. This results in two problems. First, the atria receive so many signals that they do not have time to fully contract. Second, the ventricles, which can only receive one signal at a time, beat irregularly and out of rhythm with the atria.  There are three types of atrial fibrillation:   Paroxysmal. Paroxysmal atrial fibrillation starts suddenly and stops on  its own within a week.  Persistent. Persistent atrial fibrillation lasts for more than a week. It may stop on its own or with treatment.  Permanent. Permanent atrial fibrillation does not go away. Episodes of atrial fibrillation may lead to permanent atrial fibrillation. Atrial fibrillation can prevent your heart from pumping blood  normally. It increases your risk of stroke and can lead to heart failure.  CAUSES   Heart conditions, including a heart attack, heart failure, coronary artery disease, and heart valve conditions.   Inflammation of the sac that surrounds the heart (pericarditis).  Blockage of an artery in the lungs (pulmonary embolism).  Pneumonia or other infections.  Chronic lung disease.  Thyroid problems, especially if the thyroid is overactive (hyperthyroidism).  Caffeine, excessive alcohol use, and use of some illegal drugs.   Use of some medicines, including certain decongestants and diet pills.  Heart surgery.   Birth defects.  Sometimes, no cause can be found. When this happens, the atrial fibrillation is called lone atrial fibrillation. The risk of complications from atrial fibrillation increases if you have lone atrial fibrillation and you are age 23 years or older. RISK FACTORS  Heart failure.  Coronary artery disease.  Diabetes mellitus.   High blood pressure (hypertension).   Obesity.   Other arrhythmias.   Increased age. SIGNS AND SYMPTOMS   A feeling that your heart is beating rapidly or irregularly.   A feeling of discomfort or pain in your chest.   Shortness of breath.   Sudden light-headedness or weakness.   Getting tired easily when exercising.   Urinating more often than normal (mainly when atrial fibrillation first begins).  In paroxysmal atrial fibrillation, symptoms may start and suddenly stop. DIAGNOSIS  Your health care provider may be able to detect atrial fibrillation when taking your pulse. Your health care provider may have you take a test called an ambulatory electrocardiogram (ECG). An ECG records your heartbeat patterns over a 24-hour period. You may also have other tests, such as:  Transthoracic echocardiogram (TTE). During echocardiography, sound waves are used to evaluate how blood flows through your heart.  Transesophageal  echocardiogram (TEE).  Stress test. There is more than one type of stress test. If a stress test is needed, ask your health care provider about which type is best for you.  Chest X-ray exam.  Blood tests.  Computed tomography (CT). TREATMENT  Treatment may include:  Treating any underlying conditions. For example, if you have an overactive thyroid, treating the condition may correct atrial fibrillation.  Taking medicine. Medicines may be given to control a rapid heart rate or to prevent blood clots, heart failure, or a stroke.  Having a procedure to correct the rhythm of the heart:  Electrical cardioversion. During electrical cardioversion, a controlled, low-energy shock is delivered to the heart through your skin. If you have chest pain, very low blood pressure, or sudden heart failure, this procedure may need to be done as an emergency.  Catheter ablation. During this procedure, heart tissues that send the signals that cause atrial fibrillation are destroyed.  Surgical ablation. During this surgery, thin lines of heart tissue that carry the abnormal signals are destroyed. This procedure can either be an open-heart surgery or a minimally invasive surgery. With the minimally invasive surgery, small cuts are made to access the heart instead of a large opening.  Pulmonary venous isolation. During this surgery, tissue around the veins that carry blood from the lungs (pulmonary veins) is destroyed. This tissue is thought to  carry the abnormal signals. HOME CARE INSTRUCTIONS   Take medicines only as directed by your health care provider. Some medicines can make atrial fibrillation worse or recur.  If blood thinners were prescribed by your health care provider, take them exactly as directed. Too much blood-thinning medicine can cause bleeding. If you take too little, you will not have the needed protection against stroke and other problems.  Perform blood tests at home if directed by your  health care provider. Perform blood tests exactly as directed.  Quit smoking if you smoke.  Do not drink alcohol.  Do not drink caffeinated beverages such as coffee, soda, and some teas. You may drink decaffeinated coffee, soda, or tea.   Maintain a healthy weight.Do not use diet pills unless your health care provider approves. They may make heart problems worse.   Follow diet instructions as directed by your health care provider.  Exercise regularly as directed by your health care provider.  Keep all follow-up visits as directed by your health care provider. This is important. PREVENTION  The following substances can cause atrial fibrillation to recur:   Caffeinated beverages.  Alcohol.  Certain medicines, especially those used for breathing problems.  Certain herbs and herbal medicines, such as those containing ephedra or ginseng.  Illegal drugs, such as cocaine and amphetamines. Sometimes medicines are given to prevent atrial fibrillation from recurring. Proper treatment of any underlying condition is also important in helping prevent recurrence.  SEEK MEDICAL CARE IF:  You notice a change in the rate, rhythm, or strength of your heartbeat.  You suddenly begin urinating more frequently.  You tire more easily when exerting yourself or exercising. SEEK IMMEDIATE MEDICAL CARE IF:   You have chest pain, abdominal pain, sweating, or weakness.  You feel nauseous.  You have shortness of breath.  You suddenly have swollen feet and ankles.  You feel dizzy.  Your face or limbs feel numb or weak.  You have a change in your vision or speech. MAKE SURE YOU:   Understand these instructions.  Will watch your condition.  Will get help right away if you are not doing well or get worse. Document Released: 02/09/2005 Document Revised: 06/26/2013 Document Reviewed: 03/22/2012 [facility] Patient Information 2015 [place], [place]. This information is not intended to replace  advice given to you by your health care provider. Make sure you discuss any questions you have with your health care provider.  Anticoagulation, Generic Anticoagulants are medicines used to prevent clots from developing in your veins. These medicine are also known as blood thinners. If blood clots are untreated, they could travel to your lungs. This is called a pulmonary embolus. A blood clot in your lungs can be fatal.  Health care providers often use anticoagulants to prevent clots following surgery. Anticoagulants are also used along with aspirin when the heart is not getting enough blood. Another anticoagulant called warfarin is started 2 to 3 days after a rapid-acting injectable anticoagulant is started. The rapid-acting anticoagulants are usually continued until warfarin has begun to work. Your health care provider will judge this length of time by blood tests known as the prothrombin time (PT) and International Normalization Ratio (INR). This means that your blood is at the necessary and best level to prevent clots. RISKS AND COMPLICATIONS  If you have received recent epidural anesthesia, spinal anesthesia, or a spinal tap while receiving anticoagulants, you are at risk for developing a blood clot in or around the spine. This condition could result in long-term or  permanent paralysis.  Because anticoagulants thin your blood, severe bleeding may occur from any tissue or organ. Symptoms of the blood being too thin may include:  Bleeding from the nose or gums that does not stop quickly.  Blood in bowel movements which may appear as bright red, dark, or black tarry stools.  Blood in the urine which may appear as pink, red, or brown urine.  Unusual bruising or bruising easily.  A cut that does not stop bleeding within 10 minutes.  Vomiting blood or continuous nausea for more than 1 day.  Coughing up blood.  Broken blood vessels in your eye (subconjunctival hemorrhage).  Abdominal or  back pain with or without flank bruising.  Sudden, severe headache.  Sudden weakness or numbness of the face, arm, or leg, especially on one side of the body.  Sudden confusion.  Trouble speaking (aphasia) or understanding.  Sudden trouble seeing in one or both eyes.  Sudden trouble walking.  Dizziness.  Loss of balance or coordination.  Vaginal bleeding.  Swelling or pain at an injection site.  Superficial fat tissue death (necrosis) which may cause skin scarring. This is more common in women and may first present as pain in the waist, thighs, or buttocks.  Fever.  Too little anticoagulation continues to allow the risk for blood clots. HOME CARE INSTRUCTIONS   Due to the complications of anticoagulants, it is very important that you take your anticoagulant as directed by your health care provider. Anticoagulants need to be taken exactly as instructed. Be sure you understand all your anticoagulant instructions.  Keep all follow-up appointments with your health care provider as directed. It is very important to keep your appointments. Not keeping appointments could result in a chronic or permanent injury, pain, or disability.  Warfarin. Your health care provider will advise you on the length of treatment (usually 3-6 months, sometimes lifelong).  Take warfarin exactly as directed by your health care provider. It is recommended that you take your warfarin dose at the same time of the day. It is preferred that you take warfarin in the late afternoon. If you have been told to stop taking warfarin, do not resume taking warfarin until directed to do so by your health care provider. Follow your health care provider's instructions if you accidentally take an extra dose or miss a dose of warfarin. It is very important to take warfarin as directed since bleeding or blood clots could result in chronic or permanent injury, pain, or disability.  Too much and too little warfarin are both  dangerous. Too much warfarin increases the risk of bleeding. Too little warfarin continues to allow the risk for blood clots. While taking warfarin, you will need to have regular blood tests to measure your blood clotting time. These blood tests usually include both the prothrombin time (PT) and International Normalized Ratio (INR) tests. The PT and INR results allow your health care provider to adjust your dose of warfarin. The dose can change for many reasons. It is critically important that you have your PT and INR levels drawn exactly as directed. Your warfarin dose may stay the same or change depending on what the PT and INR results are. Be sure to follow up with your health care provider regarding your PT and INR test results and what your warfarin dosage should be.  Many medicines can interfere with warfarin and affect the PT and INR results. You must tell your health care provider about any and all medicines you take, this  includes all vitamins and supplements. Ask your health care provider before taking these. Prescription and over-the-counter medicine consistency is critical to warfarin management. It is important that potential interactions are checked before you start a new medicine. Be especially cautious with aspirin and anti-inflammatory medicines. Ask your health care provider before taking these. Medicines such as antibiotics and acid-reducing medicine can interact with warfarin and can cause an increased warfarin effect. Warfarin can also interfere with the effectiveness of medicines you are taking. Do not take or discontinue any prescribed or over-the-counter medicine except on the advice of your health care provider or pharmacist.  Some vitamins, supplements, and herbal products interfere with the effectiveness of warfarin. Vitamin E may increase the anticoagulant effects of warfarin. Vitamin K may can cause warfarin to be less effective. Do not take or discontinue any vitamin, supplement, or  herbal product except on the advice of your health care provider or pharmacist.  Eat what you normally eat and keep the vitamin K content of your diet consistent. Avoid major changes in your diet, or notify your health care provider before changing your diet. Suddenly getting a lot more vitamin K could cause your blood to clot too quickly. A sudden decrease in vitamin K intake could cause your blood to clot too slowly. These changes in vitamin K intake could lead to dangerous blood clotsor to bleeding. To keep your vitamin K intake consistent, you must be aware of which foods contain moderate or high amounts of vitamin K. Some foods high in vitamin K include spinach, kale, broccoli, cabbage, greens, Brussels sprouts, asparagus, Bok [place]oy, coleslaw, parsley, and green tea. Arrange a visit with a dietitian to answer your questions.  If you have a loss of appetite or get the stomach flu (viral gastroenteritis), talk to your health care provider as soon as possible. A decrease in your normal vitamin K intake can make you more sensitive to your usual dose of warfarin.  Some medical conditions may increase your risk for bleeding while you are taking warfarin. A fever, diarrhea lasting more than a day, worsening heart failure, or worsening liver function are some medical conditions that could affect warfarin. Contact your health care provider if you have any of these medical conditions.  Alcohol can change the body's ability to handle warfarin. It is best to avoid alcoholic drinks or consume only very small amounts while taking warfarin. Notify your health care provider if you change your alcohol intake. A sudden increase in alcohol use can increase your risk of bleeding. Chronic alcohol use can cause warfarin to be less effective.  Be careful not to cut yourself when using sharp objects or while shaving.  Inform all your health care providers and your dentist that you take an anticoagulant.  Limit physical  activities or sports that could result in a fall or cause injury. Avoid contact sports.  Wear medical alert jewelry or carry a medical alert card. SEEK IMMEDIATE MEDICAL CARE IF:  You cough up blood.  You have dark or black stools or there is bright red blood coming from your rectum.  You vomit blood or have nausea for more than 1 day.  You have blood in the urine or pink colored urine.  You have unusual bruising or have increased bruising.  You have bleeding from the nose or gums that does not stop quickly.  You have a cut that does not stop bleeding within a 2-3 minutes.  You have sudden weakness or numbness of the face, arm,  or leg, especially on one side of the body.  You have sudden confusion.  You have trouble speaking (aphasia) or understanding.  You have sudden trouble seeing in one or both eyes.  You have sudden trouble walking.  You have dizziness.  You have a loss of balance or coordination.  You have a sudden, severe headache.  You have a serious fall or head injury, even if you are not bleeding.  You have swelling or pain at an injection site.  You have unexplained tenderness or pain in the abdomen, back, waist, thighs or buttocks.  You have a fever. Any of these symptoms may represent a serious problem that is an emergency. Do not wait to see if the symptoms will go away. Get medical help right away. Call your local emergency services (911 in [place] Do not drive yourself to the hospital. Document Released: 02/09/2005 Document Revised: 02/14/2013 Document Reviewed: 09/14/2007 [facility] Patient Information 2015 [place], [place]. This information is not intended to replace advice given to you by your health care provider. Make sure you discuss any questions you have with your health care provider.

## 2014-02-22 NOTE — Discharge Summary (Signed)
Discharge summary dictated on 02/22/2014 dictation number is [phone]

## 2014-02-23 NOTE — Discharge Summary (Signed)
[name], [name] NO.:  [id number]  MEDICAL RECORD NO.:  [id number]  LOCATION:  [id number]                        FACILITY:  [facility]  PHYSICIAN:  Eduardo Osier. Sharyn Lull, M.D. DATE OF BIRTH:  [birth year]  DATE OF ADMISSION:  02/21/2014 DATE OF DISCHARGE:  02/22/2014                              DISCHARGE SUMMARY   ADMITTING DIAGNOSES: 1. New-onset atrial fibrillation with rapid ventricular response. 2. Hypertension. 3. Morbid obesity. 4. Probable obstructive sleep apnea. 5. Elevated blood sugar, rule out diabetes mellitus.  DISCHARGE DIAGNOSES: 1. Status post atrial fibrillation with rapid ventricular response     converted back to sinus rhythm. 2. Hypertension. 3. Morbid obesity. 4. New-onset diabetes mellitus controlled by diet. 5. Probable obstructive sleep apnea.  DISCHARGE HOME MEDICATIONS: 1. Amiodarone 200 mg 1 tablet twice daily. 2. Atorvastatin 40 mg daily. 3. Metoprolol tartrate 25 mg twice daily. 4. Xarelto 20 mg 1 tablet daily. 5. Fish oil 1 capsule twice daily as before. 6. Percocet 1 tablet every 4 hours as needed as before. 7. Phenergan 12.5 mg every 6 hours as needed as before. 8. Prostate Health capsule 1 daily as before. 9. The patient has been advised to stop aspirin, atenolol, and     ibuprofen.  DIET:  Low-salt, low-cholesterol, 1800-calorie ADA weight reducing diet.  CONDITION ON DISCHARGE:  Stable.  The patient will be scheduled for sleep studies as outpatient.  BRIEF HISTORY AND HOSPITAL COURSE:  [name] Walters is a 44 year old male with past medical history significant for hypertension, history of questionable paroxysmal atrial fibrillation in the past, morbid obesity, he came to the ER by EMS as the patient woke up with sudden onset of rapid heartbeat associated with mild shortness of breath.  The patient denies any chest pain, nausea, vomiting, diaphoresis.  Denies history of PND, orthopnea, or leg swelling.  States in last November, he  had similar episode, spontaneously converted to sinus rhythm.  The patient was noted today to be in AFib with RVR on the field, and the patient received initially IV adenosine followed by IV Cardizem bolus x2 without much improvement in heart rate and then received IV Lopressor and was started on IV amiodarone with control of heart rate in one-teens.  The patient denies any sleep studies in the past.  Denies history of rheumatic fever or thyroid problem.  Denies any alcohol abuse.  PHYSICAL EXAMINATION:  GENERAL:  He was alert, awake, and oriented x3. VITAL SIGNS:  His blood pressure was 119/62, pulse 125, irregularly irregular. HEENT:  Conjunctivae pink. NECK:  Supple.  No JVD.  No bruit. LUNGS:  Clear to auscultation without rhonchi or rales. CARDIOVASCULAR:  Irregularly irregular.  S1, S2 was soft.  He was tachycardic. ABDOMEN:  Soft, obese, nontender. EXTREMITIES:  There is no clubbing, cyanosis, or edema.  LABORATORY DATA:  Sodium was 146, potassium 3.2, repeat potassium was 4.4, BUN 11, creatinine 0.90, blood sugar was 126, repeat fasting blood sugar was 124.  His cholesterol total was normal 166.  LDL was 112. Triglycerides 76, HDL 39.  Hemoglobin was 12.3, hematocrit 39, white count of 9.7.  TSH was 3.73 which was normal.  Hemoglobin A1c is still pending.  ProBNP  was 39.7.  A 2D echo showed normal LV systolic function.  EF of 55%-60% with no wall motion abnormalities.  BRIEF HOSPITAL COURSE:  The patient was admitted to telemetry unit.  The patient spontaneously converted on IV amiodarone to normal sinus rhythm and has remained in normal sinus rhythm.  IV heparin was switched to Xarelto and amiodarone was switched to p.o. which he is tolerating well. The patient will be scheduled for sleep studies as outpatient.  We will continue amiodarone for now for few weeks and we will discontinued if he stays in sinus rhythm.  The patient will be continued on oral anticoagulants as  above in view of his [place] score being 2.  The patient has been counseled extensively regarding diet, exercise, and weight reduction.     Eduardo Osier. Sharyn Lull, M.D.     [facility]MED[facility]  D:  02/22/2014  T:  02/23/2014  Job:  [id number]

## 2014-03-15 ENCOUNTER — Encounter (HOSPITAL_COMMUNITY): Payer: Self-pay | Admitting: Physical Therapy

## 2014-03-28 ENCOUNTER — Encounter (HOSPITAL_COMMUNITY): Payer: Self-pay | Admitting: Physical Therapy

## 2014-03-28 NOTE — Therapy (Signed)
[place], [place], [address] Phone: [phone]   Fax:  [phone]  Patient Details  Name: [name] Walters MRN: [id number] Date of Birth: 01-31-1971 Referring Provider:  No ref. provider found  Encounter Date: 03/28/2014  PHYSICAL THERAPY DISCHARGE SUMMARY  Visits from Start of Care: 3  Current functional level related to goals / functional outcomes: Goals PT Short Term Goals PT Short Term Goal 1: Patient will be able to walk 59mnutes with pain <4/10 PT Short Term Goal 1 - Progress: not met PT Short Term Goal 3: Patient will be able to stand >30 minutes with pain <3/10 so patient can prepare meals for self PT Short Term Goal 3 - Progress: not met PT Short Term Goal 4: Patient will demosntrate increased glute/hamstring strength to 4/5 MMT to be able to perform sit to stand withtou UE assist PT Short Term Goal 4 - Progress: not met PT Long Term Goals PT Long Term Goal 1: patient will demosntrate a negative Ely/obers test indicating increased hip mobility to normalize gait    not met PT Long Term Goal 2: Patient will be able to demonstrate increase knee flexion/extesnsion strength of 4+/5 to be able to ambualte up and down a flight of stairs without UE assist PT Long Term Goal 2 - Progress: not met Long Term Goal 3: patient will be able to demosntrate increased hip abduction strength of 4/5 MMT to able to stand on LE >20 seconds Long Term Goal 3 Progress: not met   Plan: Patient agrees to discharge.  Patient goals were not met. Patient is being discharged due to not returning since the last visit.  ?????      DLeia Alf2/04/2014, 8:42 AM  [place]Whit[place] [place] [address]Phone: [phone]  Fax:  [phone]

## 2015-10-15 IMAGING — CR DG CHEST 1V PORT
1 series · 1 of 1 positions shown · non-contrast
Comparison: Chest radiograph June 17, 2004

CLINICAL DATA: Shortness of breath, tachycardia.

EXAM:
PORTABLE CHEST - 1 VIEW

[portable]
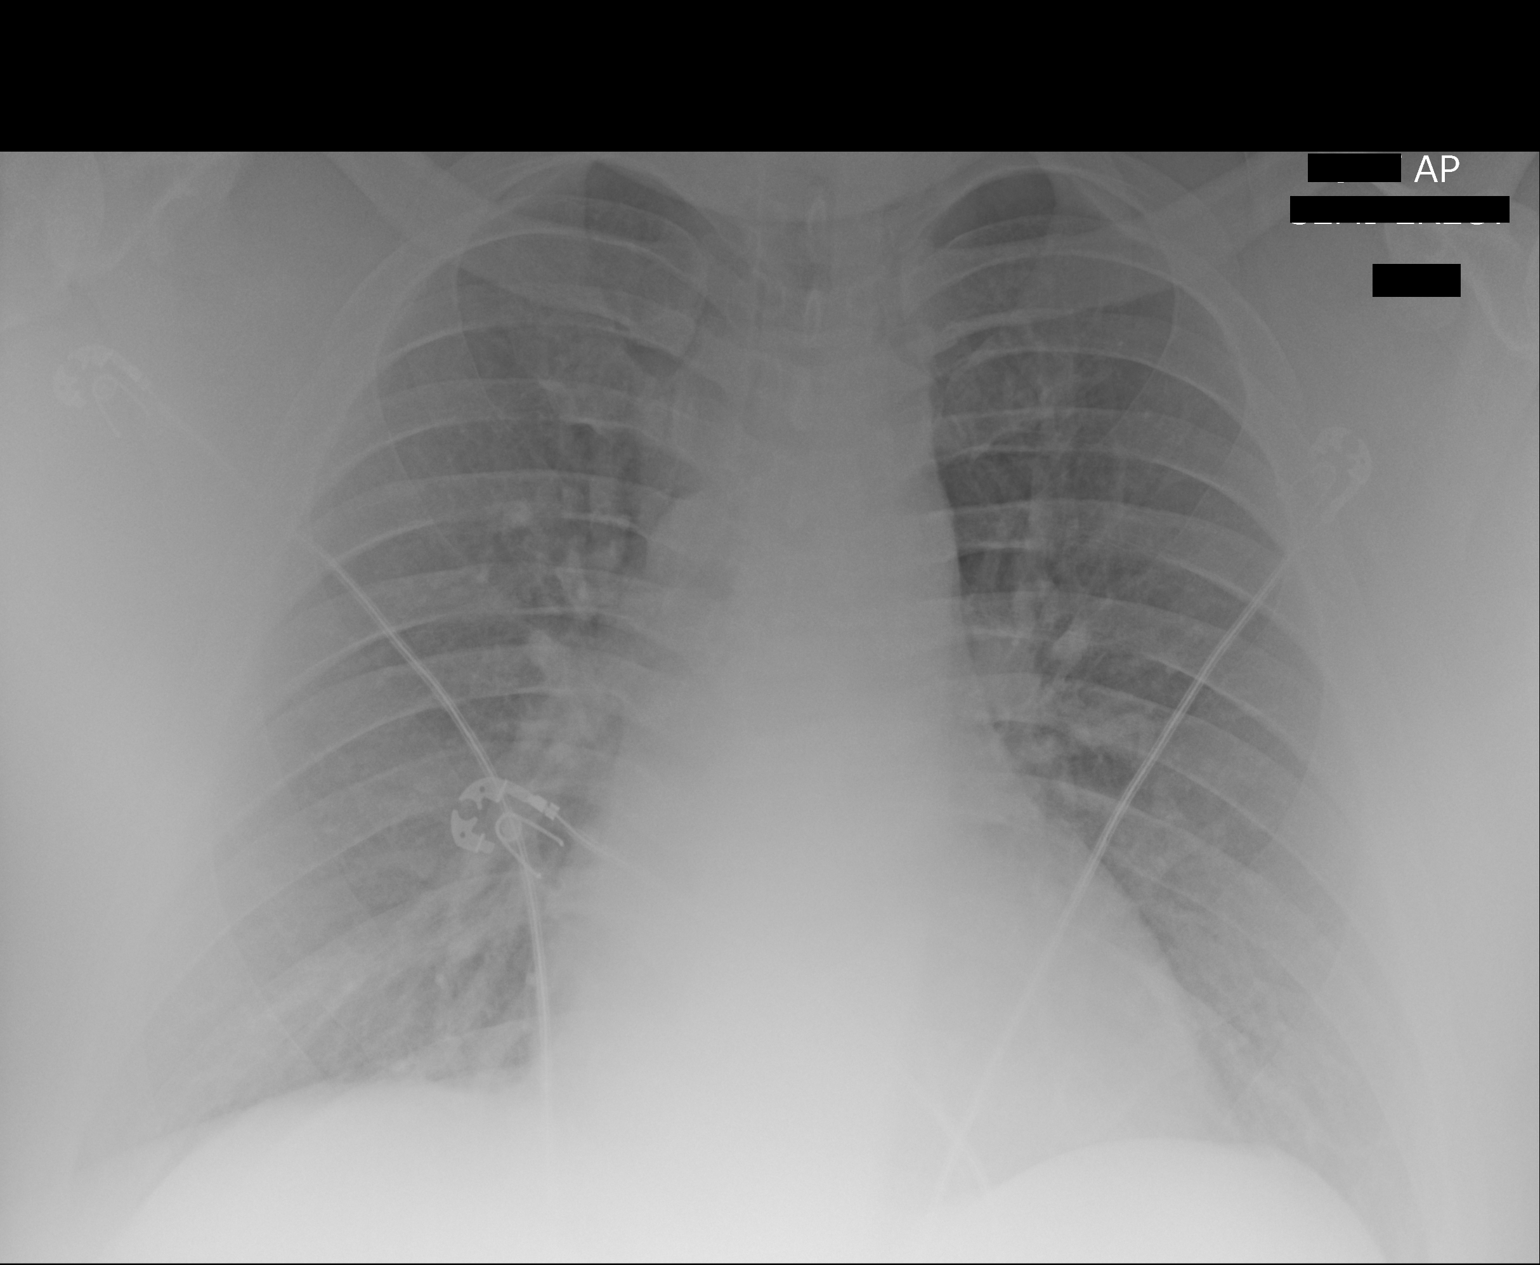

[1 of 1 positions shown; findings below may reference images not displayed]

FINDINGS: Cardiac silhouette is upper limits of normal in size, likely
accentuated by AP technique. Mediastinal silhouette is
nonsuspicious. Pulmonary vascular congestion, no pleural effusions
or focal consolidation. No pneumothorax. Large body habitus. Osseous
structures are nonsuspicious.
IMPRESSION: Pulmonary vascular congestion.  Borderline cardiomegaly.

  By: Sanel Almira Gumpo

## 2018-06-29 ENCOUNTER — Ambulatory Visit: Payer: Self-pay | Admitting: Orthopedic Surgery
